# Patient Record
Sex: Female | Born: 1975 | Race: White | Hispanic: No | Marital: Married | Smoking: Former smoker
Health system: Southern US, Community
[De-identification: ages and names within clinical notes are randomized; demographics above are authoritative.]

## PROBLEM LIST (undated history)

## (undated) DIAGNOSIS — O24419 Gestational diabetes mellitus in pregnancy, unspecified control: Secondary | ICD-10-CM

## (undated) DIAGNOSIS — R748 Abnormal levels of other serum enzymes: Secondary | ICD-10-CM

## (undated) DIAGNOSIS — K760 Fatty (change of) liver, not elsewhere classified: Secondary | ICD-10-CM

## (undated) DIAGNOSIS — I10 Essential (primary) hypertension: Secondary | ICD-10-CM

## (undated) DIAGNOSIS — E119 Type 2 diabetes mellitus without complications: Secondary | ICD-10-CM

## (undated) DIAGNOSIS — N181 Chronic kidney disease, stage 1: Secondary | ICD-10-CM

## (undated) DIAGNOSIS — Z8759 Personal history of other complications of pregnancy, childbirth and the puerperium: Secondary | ICD-10-CM

## (undated) DIAGNOSIS — E663 Overweight: Secondary | ICD-10-CM

## (undated) DIAGNOSIS — E785 Hyperlipidemia, unspecified: Secondary | ICD-10-CM

## (undated) HISTORY — DX: Hereditary hemochromatosis: E83.110

## (undated) HISTORY — PX: WISDOM TOOTH EXTRACTION: SHX21

## (undated) HISTORY — DX: Gestational diabetes mellitus in pregnancy, unspecified control: O24.419

## (undated) HISTORY — DX: Fatty (change of) liver, not elsewhere classified: K76.0

## (undated) HISTORY — DX: Hyperlipidemia, unspecified: E78.5

## (undated) HISTORY — DX: Chronic kidney disease, stage 1: N18.1

## (undated) HISTORY — DX: Abnormal levels of other serum enzymes: R74.8

## (undated) HISTORY — DX: Personal history of other complications of pregnancy, childbirth and the puerperium: Z87.59

## (undated) HISTORY — DX: Overweight: E66.3

## (undated) HISTORY — DX: Type 2 diabetes mellitus without complications: E11.9

## (undated) HISTORY — DX: Essential (primary) hypertension: I10

---

## 2011-12-28 ENCOUNTER — Encounter: Payer: Self-pay | Admitting: Pediatric Cardiology

## 2012-01-04 ENCOUNTER — Encounter: Payer: Self-pay | Admitting: Pediatric Cardiology

## 2012-03-16 ENCOUNTER — Inpatient Hospital Stay: Payer: Self-pay

## 2012-03-16 ENCOUNTER — Observation Stay: Payer: Self-pay | Admitting: Obstetrics and Gynecology

## 2012-03-16 LAB — PIH PROFILE
Anion Gap: 12 (ref 7–16)
Chloride: 106 mmol/L (ref 98–107)
Creatinine: 0.83 mg/dL (ref 0.60–1.30)
EGFR (Non-African Amer.): >60
HCT: 33.2 % — ABNORMAL LOW (ref 35.0–47.0)
HGB: 11.3 g/dL — ABNORMAL LOW (ref 12.0–16.0)
MCHC: 34.1 g/dL (ref 32.0–36.0)
Osmolality: 277 (ref 275–301)
Potassium: 3.9 mmol/L (ref 3.5–5.1)
RBC: 3.96 10*6/uL (ref 3.80–5.20)
RDW: 14.1 % (ref 11.5–14.5)
WBC: 8.9 10*3/uL (ref 3.6–11.0)

## 2012-03-16 LAB — PROTEIN / CREATININE RATIO, URINE: Creatinine, Urine: 172.9 mg/dL — ABNORMAL HIGH (ref 30.0–125.0)

## 2012-11-20 ENCOUNTER — Ambulatory Visit: Payer: Self-pay | Admitting: Family Medicine

## 2013-02-15 HISTORY — DX: Hereditary hemochromatosis: E83.110

## 2013-10-03 ENCOUNTER — Other Ambulatory Visit: Payer: Self-pay | Admitting: Urgent Care

## 2013-10-03 LAB — IRON: Iron: 36 ug/dL — ABNORMAL LOW (ref 50–170)

## 2013-10-03 LAB — FERRITIN: FERRITIN (ARMC): 87 ng/mL (ref 8–388)

## 2013-10-03 LAB — HEPATIC FUNCTION PANEL A (ARMC)
ALBUMIN: 3.8 g/dL (ref 3.4–5.0)
ALT: 75 U/L — AB
AST: 52 U/L — AB (ref 15–37)
Alkaline Phosphatase: 170 U/L — ABNORMAL HIGH
Bilirubin, Direct: <0.1 mg/dL (ref 0.00–0.20)
Bilirubin,Total: 0.4 mg/dL (ref 0.2–1.0)
Total Protein: 8.2 g/dL (ref 6.4–8.2)

## 2013-10-03 LAB — ETHANOL
Ethanol %: <0.003 % (ref 0.000–0.080)
Ethanol: <3 mg/dL

## 2013-10-03 LAB — TSH: Thyroid Stimulating Horm: 3.41 u[IU]/mL

## 2013-10-03 LAB — PROTIME-INR
INR: 1
PROTHROMBIN TIME: 12.7 s (ref 11.5–14.7)

## 2013-10-10 ENCOUNTER — Ambulatory Visit: Payer: Self-pay | Admitting: Urgent Care

## 2014-06-25 NOTE — H&P (Signed)
L&D Evaluation:  History Expanded:   HPI 39 yo WF G2P1001 who presnts form the office for elevated BP and headache. labs in the hospital showed p/c ratio of 1105mg , Uric acid 7.2, plt 184, and glucose of 125. CR is 0.83. Pt conceived with ICSI and IVF, she is GBS pos, RUBI, VZI and a gestational diabetic diet controlled with all four glucoses being elevated in the 3 hr OGTT 152/237/246/185, her 1 hr was 265. glucose on admission was 126. unknown blood type and computers are down for internet and cannot get it, HepB is not available now either.    Gravida 2    Term 1    PreTerm 0    Abortion 0    Living 1    Group B Strep Results Maternal (Result >5wks must be treated as unknown) positive    Maternal HIV Negative    Maternal Syphilis Ab Nonreactive    Maternal Varicella Immune    Rubella Results (Maternal) immune    Maternal T-Dap Immune    Presents with elevated BP and headache    Patient's Medical History Diabetes  gestational but most likely long standing,    Patient's Surgical History none    Medications Pre Natal Vitamins    Allergies NKDA    Social History none    Family History Non-Contributory    Current Prenatal Course Notable For gestational   ROS:   ROS All systems were reviewed.  HEENT, CNS, GI, GU, Respiratory, CV, Renal and Musculoskeletal systems were found to be normal.   Exam:   Vital Signs BP >140/90    Urine Protein 2+    General no apparent distress    Mental Status clear    Chest clear    Heart normal sinus rhythm    Abdomen gravid, non-tender    Estimated Fetal Weight Average for gestational age    Fetal Position vertex    Back no CVAT    Edema 1+    Clonus negative    Pelvic no external lesions, 1 cm thick    Mebranes Intact    FHT normal rate with no decels, strictly reactive no decels    Fetal Heart Rate 140    Ucx irregular    Skin dry    Lymph no lymphadenopathy   Impression:   Impression early labor,  evaluation for PIH   Plan:   Plan UA, monitor contractions and for cervical change, monitor BP, PIH panel    Comments admit due to labs and induce with cervidil and then pitocin n the am get plt q 6 hours    Follow Up Appointment in 6 weeks   Electronic Signatures: Adria DevonKlett, Maleiya Pergola (MD)  (Signed 30-Jan-14 21:08)  Authored: L&D Evaluation   Last Updated: 30-Jan-14 21:08 by Adria DevonKlett, Joliana Claflin (MD)

## 2014-10-07 DIAGNOSIS — E1129 Type 2 diabetes mellitus with other diabetic kidney complication: Secondary | ICD-10-CM | POA: Insufficient documentation

## 2014-10-07 DIAGNOSIS — K760 Fatty (change of) liver, not elsewhere classified: Secondary | ICD-10-CM | POA: Insufficient documentation

## 2014-10-07 DIAGNOSIS — N181 Chronic kidney disease, stage 1: Secondary | ICD-10-CM | POA: Insufficient documentation

## 2014-10-07 DIAGNOSIS — R748 Abnormal levels of other serum enzymes: Secondary | ICD-10-CM | POA: Insufficient documentation

## 2014-10-07 DIAGNOSIS — E785 Hyperlipidemia, unspecified: Secondary | ICD-10-CM | POA: Insufficient documentation

## 2014-10-07 DIAGNOSIS — I129 Hypertensive chronic kidney disease with stage 1 through stage 4 chronic kidney disease, or unspecified chronic kidney disease: Secondary | ICD-10-CM | POA: Insufficient documentation

## 2014-10-07 DIAGNOSIS — E1165 Type 2 diabetes mellitus with hyperglycemia: Secondary | ICD-10-CM

## 2014-10-07 DIAGNOSIS — IMO0002 Reserved for concepts with insufficient information to code with codable children: Secondary | ICD-10-CM | POA: Insufficient documentation

## 2014-10-14 ENCOUNTER — Encounter: Payer: Self-pay | Admitting: Family Medicine

## 2014-10-14 ENCOUNTER — Ambulatory Visit (INDEPENDENT_AMBULATORY_CARE_PROVIDER_SITE_OTHER): Payer: 59 | Admitting: Family Medicine

## 2014-10-14 VITALS — BP 159/114 | HR 92 | Temp 98.3°F | Ht 67.0 in | Wt 179.0 lb

## 2014-10-14 DIAGNOSIS — R748 Abnormal levels of other serum enzymes: Secondary | ICD-10-CM

## 2014-10-14 DIAGNOSIS — E1129 Type 2 diabetes mellitus with other diabetic kidney complication: Secondary | ICD-10-CM | POA: Diagnosis not present

## 2014-10-14 DIAGNOSIS — E785 Hyperlipidemia, unspecified: Secondary | ICD-10-CM

## 2014-10-14 DIAGNOSIS — E1165 Type 2 diabetes mellitus with hyperglycemia: Secondary | ICD-10-CM | POA: Diagnosis not present

## 2014-10-14 DIAGNOSIS — IMO0002 Reserved for concepts with insufficient information to code with codable children: Secondary | ICD-10-CM

## 2014-10-14 DIAGNOSIS — E663 Overweight: Secondary | ICD-10-CM

## 2014-10-14 DIAGNOSIS — I10 Essential (primary) hypertension: Secondary | ICD-10-CM | POA: Diagnosis not present

## 2014-10-14 DIAGNOSIS — K760 Fatty (change of) liver, not elsewhere classified: Secondary | ICD-10-CM | POA: Diagnosis not present

## 2014-10-14 MED ORDER — AMLODIPINE BESYLATE 10 MG PO TABS: 10.0000 mg | ORAL_TABLET | Freq: Every day | ORAL | Status: DC

## 2014-10-14 NOTE — Assessment & Plan Note (Signed)
Uncontrolled; will start back on amlodipine, HOLD ACE-I for now

## 2014-10-14 NOTE — Assessment & Plan Note (Signed)
Was evaluated by GI in 2015; US done; check labs today; keep control of cholesterol and sugars

## 2014-10-14 NOTE — Assessment & Plan Note (Signed)
Check lipids today, last food was breakfast (two sausage biscuits, skipped lunch)

## 2014-10-14 NOTE — Assessment & Plan Note (Signed)
Check liver functions today

## 2014-10-14 NOTE — Assessment & Plan Note (Signed)
Check A1C and urine microalbumin 

## 2014-10-14 NOTE — Patient Instructions (Addendum)
Start the amlodipine 10 mg daily for blood pressure Start checking sugars 3x a day We'll contact you tomorrow with the lab results Try the DASH guidelines Return in 2 weeks for recheck of your blood pressure and please do bring your blood sugar readings with you then Please do see your eye doctor every year Avoid NSAIDS like Advil, Motrin, Aleve, etc. which can raise blood pressure I'm glad you and your husband are going to start walking more, as that will help lower your blood pressure and sugars and cholesterol   DASH Eating Plan DASH stands for "Dietary Approaches to Stop Hypertension." The DASH eating plan is a healthy eating plan that has been shown to reduce high blood pressure (hypertension). Additional health benefits may include reducing the risk of type 2 diabetes mellitus, heart disease, and stroke. The DASH eating plan may also help with weight loss. WHAT DO I NEED TO KNOW ABOUT THE DASH EATING PLAN? For the DASH eating plan, you will follow these general guidelines:  Choose foods with a percent daily value for sodium of less than 5% (as listed on the food label).  Use salt-free seasonings or herbs instead of table salt or sea salt.  Check with your health care provider or pharmacist before using salt substitutes.  Eat lower-sodium products, often labeled as "lower sodium" or "no salt added."  Eat fresh foods.  Eat more vegetables, fruits, and low-fat dairy products.  Choose whole grains. Look for the word "whole" as the first word in the ingredient list.  Choose fish and skinless chicken or [place] more often than red meat. Limit fish, poultry, and meat to 6 oz (170 g) each day.  Limit sweets, desserts, sugars, and sugary drinks.  Choose heart-healthy fats.  Limit cheese to 1 oz (28 g) per day.  Eat more home-cooked food and less restaurant, buffet, and fast food.  Limit fried foods.  Cook foods using methods other than frying.  Limit canned vegetables. If you  do use them, rinse them well to decrease the sodium.  When eating at a restaurant, ask that your food be prepared with less salt, or no salt if possible. WHAT FOODS CAN I EAT? Seek help from a dietitian for individual calorie needs. Grains Whole grain or whole wheat bread. Brown rice. Whole grain or whole wheat pasta. Quinoa, bulgur, and whole grain cereals. Low-sodium cereals. Corn or whole wheat flour tortillas. Whole grain cornbread. Whole grain crackers. Low-sodium crackers. Vegetables Fresh or frozen vegetables (raw, steamed, roasted, or grilled). Low-sodium or reduced-sodium tomato and vegetable juices. Low-sodium or reduced-sodium tomato sauce and paste. Low-sodium or reduced-sodium canned vegetables.  Fruits All fresh, canned (in natural juice), or frozen fruits. Meat and Other Protein Products Ground beef (85% or leaner), grass-fed beef, or beef trimmed of fat. Skinless chicken or [place]. Ground chicken or [place]. Pork trimmed of fat. All fish and seafood. Eggs. Dried beans, peas, or lentils. Unsalted nuts and seeds. Unsalted canned beans. Dairy Low-fat dairy products, such as skim or 1% milk, 2% or reduced-fat cheeses, low-fat ricotta or cottage cheese, or plain low-fat yogurt. Low-sodium or reduced-sodium cheeses. Fats and Oils Tub margarines without trans fats. Light or reduced-fat mayonnaise and salad dressings (reduced sodium). Avocado. Safflower, olive, or canola oils. Natural peanut or almond butter. Other Unsalted popcorn and pretzels. The items listed above may not be a complete list of recommended foods or beverages. Contact your dietitian for more options. WHAT FOODS ARE NOT RECOMMENDED? Grains White bread. White pasta. White rice.  Refined cornbread. Bagels and croissants. Crackers that contain trans fat. Vegetables Creamed or fried vegetables. Vegetables in a cheese sauce. Regular canned vegetables. Regular canned tomato sauce and paste. Regular tomato and vegetable  juices. Fruits Dried fruits. Canned fruit in light or heavy syrup. Fruit juice. Meat and Other Protein Products Fatty cuts of meat. Ribs, chicken wings, bacon, sausage, bologna, salami, chitterlings, fatback, hot dogs, bratwurst, and packaged luncheon meats. Salted nuts and seeds. Canned beans with salt. Dairy Whole or 2% milk, cream, half-and-half, and cream cheese. Whole-fat or sweetened yogurt. Full-fat cheeses or blue cheese. Nondairy creamers and whipped toppings. Processed cheese, cheese spreads, or cheese curds. Condiments Onion and garlic salt, seasoned salt, table salt, and sea salt. Canned and packaged gravies. Worcestershire sauce. Tartar sauce. Barbecue sauce. Teriyaki sauce. Soy sauce, including reduced sodium. Steak sauce. Fish sauce. Oyster sauce. Cocktail sauce. Horseradish. Ketchup and mustard. Meat flavorings and tenderizers. Bouillon cubes. Hot sauce. Tabasco sauce. Marinades. Taco seasonings. Relishes. Fats and Oils Butter, stick margarine, lard, shortening, ghee, and bacon fat. Coconut, palm kernel, or palm oils. Regular salad dressings. Other Pickles and olives. Salted popcorn and pretzels. The items listed above may not be a complete list of foods and beverages to avoid. Contact your dietitian for more information. WHERE CAN I FIND MORE INFORMATION? National Heart, Lung, and Blood Institute: [employer] Document Released: 01/21/2011 Document Revised: 06/18/2013 Document Reviewed: 12/06/2012 [employer] Patient Information 2015 [place], [place]. This information is not intended to replace advice given to you by your health care provider. Make sure you discuss any questions you have with your health care provider.

## 2014-10-14 NOTE — Progress Notes (Signed)
BP 159/114 mmHg  Pulse 92  Temp(Src) 98.3 F (36.8 C)  Ht 5\' 7"  (1.702 m)  Wt 179 lb (81.194 kg)  BMI 28.03 kg/m2  SpO2 97%  LMP 09/21/2014 (Exact Date)   Subjective:    Patient ID: Monica Sims, female    DOB: 31-Oct-1975, 39 y.o.   MRN: [id number]  HPI: Monica Sims is a 39 y.o. female  Chief Complaint  Patient presents with  . Diabetes  . Hypertension  . Hyperlipidemia   She just had a wisdom tooth pulled, BP 189 over something at dentist's office; last BP meds 2-3 months ago; not adding salt to food at home; no decongestants; drinks diet [facility], two 20 ounce bottles a day; she does not exercise, but is going to start walking; not a good sleeper  Diabetes; blood sugars running high, 262 last Thursday at dentist's office; not checking at home; no numbness or tingling in the feet; having some blurred vision; not much dry mouth; nocturia 2-3 x a night  Cholesterol; limits foods, rotisserie cooking, no oil; no eggs at home; occasional cheese; got off cheese while pregnant  She will get a flu shot this fall; she does not want a pneumonia vaccine  Relevant past medical, surgical, family and social history reviewed and updated as indicated. Interim medical history since our last visit reviewed. Allergies and medications reviewed and updated.  Review of Systems  Per HPI unless specifically indicated above     Objective:    BP 159/114 mmHg  Pulse 92  Temp(Src) 98.3 F (36.8 C)  Ht 5\' 7"  (1.702 m)  Wt 179 lb (81.194 kg)  BMI 28.03 kg/m2  SpO2 97%  LMP 09/21/2014 (Exact Date)  Wt Readings from Last 3 Encounters:  10/14/14 179 lb (81.194 kg)  01/09/14 178 lb (80.74 kg)    Physical Exam  Constitutional: She appears well-developed and well-nourished. No distress.  HENT:  Head: Normocephalic and atraumatic.  Eyes: EOM are normal. No scleral icterus.  Neck: No thyromegaly present.  Cardiovascular: Normal rate, regular rhythm and normal heart sounds.    No murmur heard. Pulmonary/Chest: Effort normal and breath sounds normal. No respiratory distress. She has no wheezes.  Abdominal: Soft. Bowel sounds are normal. She exhibits no distension.  Musculoskeletal: Normal range of motion. She exhibits no edema.  Neurological: She is alert. She exhibits normal muscle tone.  Skin: Skin is warm and dry. She is not diaphoretic. No pallor.  Psychiatric: She has a normal mood and affect. Her behavior is normal. Judgment and thought content normal.   Diabetic Foot Form - Detailed   Diabetic Foot Exam - detailed  Diabetic Foot exam was performed with the following findings:  Yes 10/14/2014  2:15 PM  Visual Foot Exam completed.:  Yes  Are the shoes appropriate in style and fit?:  Yes  Are the toenails long?:  No  Are the toenails thick?:  No  Are the toenails ingrown?:  No    Pulse Foot Exam completed.:  Yes  Right Dorsalis Pedis:  Present Left Dorsalis Pedis:  Present  Sensory Foot Exam Completed.:  Yes  Semmes-Weinstein Monofilament Test  R Site 1-Great Toe:  Pos L Site 1-Great Toe:  Pos  R Site 4:  Pos L Site 4:  Pos  R Site 5:  Pos L Site 5:  Pos        Results for orders placed or performed in visit on 10/03/13  Protime-INR  Result Value Ref Range  Prothrombin Time 12.7 11.5-14.7 secs   INR 1.0   TSH  Result Value Ref Range   Thyroid Stimulating Horm 3.41 uIU/mL  Hepatic Function Panel A [facility])  Result Value Ref Range   SGOT(AST) 52 (H) 15-37 Unit/L   SGPT (ALT) 75 (H) U/L   Alkaline Phosphatase 170 (H) Unit/L   Albumin 3.8 3.4-5.0 g/dL   Total Protein 8.2 4.0-9.8 g/dL   Bilirubin,Total 0.4 1.1-9.1 mg/dL   Bilirubin, Direct < 0.1 0.00-0.20 mg/dL  Ferritin  Result Value Ref Range   Ferritin ([facility]) 87 8-388 ng/mL  Iron  Result Value Ref Range   Iron 36 (L) 50-170 mcg/dL  Ethanol  Result Value Ref Range   Ethanol < 3 mg/dL   Ethanol % < 4.782 9.562-1.308 %      Assessment & Plan:   Problem List Items Addressed This  Visit      Cardiovascular and Mediastinum   Hypertension    Uncontrolled; will start back on amlodipine, HOLD ACE-I for now      Relevant Medications   amLODipine (NORVASC) 10 MG tablet     Digestive   Hepatic steatosis    Check liver functions today      Relevant Orders   Comprehensive metabolic panel     Endocrine   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 - Primary    Check A1C and urine microalbumin      Relevant Orders   Hgb A1c w/o eAG   Microalbumin / creatinine urine ratio     Other   Hyperlipidemia    Check lipids today, last food was breakfast (two sausage biscuits, skipped lunch)      Relevant Medications   amLODipine (NORVASC) 10 MG tablet   Other Relevant Orders   Lipid Panel w/o Chol/HDL Ratio   Elevated liver enzymes    Was evaluated by GI in 2015; US done; check labs today; keep control of cholesterol and sugars      Relevant Orders   Comprehensive metabolic panel    Other Visit Diagnoses    Overweight (BMI 25.0-29.9)           Follow up plan: Return in 2 weeks (on 10/28/2014).  Orders Placed This Encounter  Procedures  . Hgb A1c w/o eAG  . Lipid Panel w/o Chol/HDL Ratio  . Microalbumin / creatinine urine ratio  . Comprehensive metabolic panel   Meds ordered this encounter  Medications  . HYDROcodone-acetaminophen (NORCO/VICODIN) 5-325 MG per tablet    Sig:   . DISCONTD: ibuprofen (ADVIL,MOTRIN) 600 MG tablet    Sig: Take 600 mg by mouth.  . penicillin v potassium (VEETID) 500 MG tablet    Sig: Take 500 mg by mouth.  Marland Kitchen amLODipine (NORVASC) 10 MG tablet    Sig: Take 1 tablet (10 mg total) by mouth daily.    Dispense:  30 tablet    Refill:  2

## 2014-10-15 ENCOUNTER — Telehealth: Payer: Self-pay | Admitting: Family Medicine

## 2014-10-15 LAB — HGB A1C W/O EAG: Hgb A1c MFr Bld: 9.6 % — ABNORMAL HIGH (ref 4.8–5.6)

## 2014-10-15 LAB — COMPREHENSIVE METABOLIC PANEL
A/G RATIO: 1.7 (ref 1.1–2.5)
ALT: 37 IU/L — AB (ref 0–32)
AST: 32 IU/L (ref 0–40)
Albumin: 4.3 g/dL (ref 3.5–5.5)
Alkaline Phosphatase: 134 IU/L — ABNORMAL HIGH (ref 39–117)
BUN/Creatinine Ratio: 17 (ref 9–23)
BUN: 11 mg/dL (ref 6–24)
Bilirubin Total: 0.4 mg/dL (ref 0.0–1.2)
CALCIUM: 9 mg/dL (ref 8.7–10.2)
CO2: 19 mmol/L (ref 18–29)
CREATININE: 0.65 mg/dL (ref 0.57–1.00)
Chloride: 97 mmol/L (ref 97–108)
GFR, EST AFRICAN AMERICAN: 128 mL/min/{1.73_m2} (ref >59)
GFR, EST NON AFRICAN AMERICAN: 111 mL/min/{1.73_m2} (ref >59)
GLOBULIN, TOTAL: 2.5 g/dL (ref 1.5–4.5)
Glucose: 235 mg/dL — ABNORMAL HIGH (ref 65–99)
Potassium: 4.1 mmol/L (ref 3.5–5.2)
SODIUM: 135 mmol/L (ref 134–144)
TOTAL PROTEIN: 6.8 g/dL (ref 6.0–8.5)

## 2014-10-15 LAB — LIPID PANEL W/O CHOL/HDL RATIO
CHOLESTEROL TOTAL: 189 mg/dL (ref 100–199)
HDL: 36 mg/dL — AB (ref >39)
LDL CALC: 73 mg/dL (ref 0–99)
TRIGLYCERIDES: 398 mg/dL — AB (ref 0–149)
VLDL CHOLESTEROL CAL: 80 mg/dL — AB (ref 5–40)

## 2014-10-15 LAB — MICROALBUMIN / CREATININE URINE RATIO
CREATININE, UR: 107.5 mg/dL
MICROALB/CREAT RATIO: 500.2 mg/g creat — ABNORMAL HIGH (ref 0.0–30.0)
Microalbumin, Urine: 537.7 ug/mL

## 2014-10-15 MED ORDER — INSULIN DETEMIR 100 UNIT/ML FLEXPEN: 20.0000 [IU] | PEN_INJECTOR | Freq: Every day | SUBCUTANEOUS | Status: DC

## 2014-10-15 MED ORDER — LINAGLIPTIN 5 MG PO TABS: 5.0000 mg | ORAL_TABLET | Freq: Every day | ORAL | Status: DC

## 2014-10-15 NOTE — Telephone Encounter (Signed)
Called and notified patient of results.

## 2014-10-15 NOTE — Telephone Encounter (Signed)
Let patient know that we'll get her back on some of her medicines Start back on the once-a-day long-acting insulin, Levemir Start with fifteen units once a day for five days, then go up to twenty units once a day Bring sugar readings to appt in a few weeks I'd also like to add a pill (NOT metformin); the one I am choosing is pregnancy category B; most of the other diabetes pills are category C (less safe), so this way we can get her sugars down without being overly worried in case they do conceive in the coming months Her cholesterol shows high TG, most likely because of her sugars; fish oil twice a day and really watching diet should help this Liver better than last year

## 2014-10-17 ENCOUNTER — Telehealth: Payer: Self-pay

## 2014-10-17 NOTE — Telephone Encounter (Signed)
We do not have any coupons for Levemir and I don't think that will help with it since it requires prior auth. Do you want to have Tiffany try to get it approved?

## 2014-10-17 NOTE — Telephone Encounter (Signed)
Check for Tradjenta savings cards please, not the Levemir; thanks

## 2014-10-17 NOTE — Telephone Encounter (Signed)
Her insurance is not covering her Jearld Lesch, it is $413.90. Can you please change to something else? Also The Levemir requires prior auth. Do you want [place] to get that or change to something else?

## 2014-10-17 NOTE — Telephone Encounter (Signed)
Please check for savings card and print out Rx again if she needs a new one to try to fill AFTER the savings card is activated

## 2014-10-18 ENCOUNTER — Telehealth: Payer: Self-pay

## 2014-10-18 MED ORDER — INSULIN GLARGINE 100 UNIT/ML SOLOSTAR PEN: 20.0000 [IU] | PEN_INJECTOR | Freq: Every day | SUBCUTANEOUS | Status: DC

## 2014-10-18 NOTE — Telephone Encounter (Signed)
Patient's insurance required prior auth on her Levemir. Tiffany tried to get it approved, but they denied it and said she has to try Lantus first. Do you want to change it?

## 2014-10-18 NOTE — Telephone Encounter (Signed)
Patient notified

## 2014-10-18 NOTE — Telephone Encounter (Signed)
Left message for patient to call. Coupon for Tradjenta placed up front for patient to pick up. Prior Auth request given to Tiffany for Levemir.

## 2014-10-18 NOTE — Telephone Encounter (Signed)
I sent new Rx for Lantus instead; that's fine; let her know same instructions as before, just different brand of insulin

## 2014-10-28 ENCOUNTER — Telehealth: Payer: Self-pay | Admitting: Family Medicine

## 2014-10-28 ENCOUNTER — Ambulatory Visit: Payer: 59 | Admitting: Family Medicine

## 2014-10-28 NOTE — Telephone Encounter (Signed)
Routing to provider  

## 2014-10-28 NOTE — Telephone Encounter (Signed)
I would like her on EITHER Tradjenta, Nesina, Onglyza, or Januvia Check to see which savings card(s) we have The pharmaceutical reps tell us that if she runs it through with the card first, most are either NO charge to her for two years or low co-pay Have her work with savings card and pharmacist; I'll send Rx for whichever is cheapest for her

## 2014-10-28 NOTE — Telephone Encounter (Signed)
Pt would like to speak to tradjenta is way too expensive because insurance wont cover it.

## 2014-10-30 MED ORDER — SAXAGLIPTIN HCL 5 MG PO TABS: 5.0000 mg | ORAL_TABLET | Freq: Every day | ORAL | Status: DC

## 2014-10-30 NOTE — Telephone Encounter (Signed)
Pt called stated the only medication her insurance would be willing to cover is the Onglyza. Thanks.

## 2014-10-30 NOTE — Telephone Encounter (Signed)
Patient will come pick up Rx and card

## 2014-10-30 NOTE — Telephone Encounter (Signed)
Great I have a prescription here for her and a savings card If she will activate the card BEFORE she takes the prescription in, it should cover it

## 2014-10-30 NOTE — Telephone Encounter (Signed)
Patient is going to call her insurance to see if any of these medications will be covered before she comes to pick up the discount cards.  She will let us know.  She does not want to start anything that her insurance is then going to not cover.

## 2014-11-08 ENCOUNTER — Ambulatory Visit (INDEPENDENT_AMBULATORY_CARE_PROVIDER_SITE_OTHER): Payer: 59 | Admitting: Family Medicine

## 2014-11-08 ENCOUNTER — Encounter: Payer: Self-pay | Admitting: Family Medicine

## 2014-11-08 VITALS — BP 130/84 | HR 96 | Temp 98.1°F | Wt 176.0 lb

## 2014-11-08 DIAGNOSIS — E785 Hyperlipidemia, unspecified: Secondary | ICD-10-CM | POA: Diagnosis not present

## 2014-11-08 DIAGNOSIS — I10 Essential (primary) hypertension: Secondary | ICD-10-CM

## 2014-11-08 DIAGNOSIS — E119 Type 2 diabetes mellitus without complications: Secondary | ICD-10-CM | POA: Diagnosis not present

## 2014-11-08 DIAGNOSIS — E1129 Type 2 diabetes mellitus with other diabetic kidney complication: Secondary | ICD-10-CM | POA: Diagnosis not present

## 2014-11-08 DIAGNOSIS — R809 Proteinuria, unspecified: Secondary | ICD-10-CM | POA: Diagnosis not present

## 2014-11-08 DIAGNOSIS — R748 Abnormal levels of other serum enzymes: Secondary | ICD-10-CM | POA: Diagnosis not present

## 2014-11-08 DIAGNOSIS — IMO0002 Reserved for concepts with insufficient information to code with codable children: Secondary | ICD-10-CM

## 2014-11-08 DIAGNOSIS — E1165 Type 2 diabetes mellitus with hyperglycemia: Secondary | ICD-10-CM

## 2014-11-08 DIAGNOSIS — E663 Overweight: Secondary | ICD-10-CM | POA: Diagnosis not present

## 2014-11-08 DIAGNOSIS — K760 Fatty (change of) liver, not elsewhere classified: Secondary | ICD-10-CM | POA: Diagnosis not present

## 2014-11-08 NOTE — Patient Instructions (Addendum)
Increase long-acting insulin from 20 units to 23 units every night for 3 days, and then go up to 25 units every night Goal fasting blood sugars is less than 150 Goal blood sugar after meals is less than 200 Return on or just after November 30th for fasting labs Keep up the great work with your efforts

## 2014-11-08 NOTE — Progress Notes (Signed)
BP 130/84 mmHg  Pulse 96  Temp(Src) 98.1 F (36.7 C)  Wt 176 lb (79.833 kg)  SpO2 99%  LMP 09/21/2014 (Exact Date)   Subjective:    Patient ID: Monica Sims, female    DOB: 02-22-1975, 39 y.o.   MRN: [id number]  HPI: Monica Sims is a 39 y.o. female  Chief Complaint  Patient presents with  . Hypertension  . Diabetes   Type 2 diabetes; her fasting glucose readings have been 235-285 the last two weeks; just started Onglyza on Tuesday, today was 4th dose so it hasn't taken full effect yet  Post-prandial glucose checks were just a few and we reviewed those together as well; today 235 prior to and then 349 after meal; pancake with water, no butter, piece of liver pudding; syrup was sugar-free; no fruit  Hypertension; really checking sodium on the labels at the store; she and her husband have found out that their daughter did not have any chromosomal abnormalities, nothing genetic, so they are considering trying to have another baby soon; this was brought up when we started talking about ACE-I use during pregnancy  High cholesterol; doing egg whites instead of regular eggs; she and her husband are really try ing to eat better  She is walking more, got up to 10k steps a few times; she has a monitor on her wrist; 10,177 steps she shows me on one day; goes to the mall to walk when the weather is bad; she has lost two pounds since last visit  Relevant past medical, surgical, family and social history reviewed and updated as indicated. Interim medical history since our last visit reviewed. Allergies and medications reviewed and updated.  Review of Systems Per HPI unless specifically indicated above     Objective:    BP 130/84 mmHg  Pulse 96  Temp(Src) 98.1 F (36.7 C)  Wt 176 lb (79.833 kg)  SpO2 99%  LMP 09/21/2014 (Exact Date)  Wt Readings from Last 3 Encounters:  11/08/14 176 lb (79.833 kg)  10/14/14 179 lb (81.194 kg)  01/09/14 178 lb (80.74 kg)    Physical  Exam  Constitutional: She appears well-developed and well-nourished. No distress.  overweight  HENT:  Head: Normocephalic and atraumatic.  Eyes: EOM are normal. No scleral icterus.  Neck: No thyromegaly present.  Cardiovascular: Normal rate and normal heart sounds.   No murmur heard. Pulmonary/Chest: Effort normal. No respiratory distress. She has no wheezes.  Abdominal: She exhibits no distension.  Musculoskeletal: Normal range of motion. She exhibits no edema.  Neurological: She is alert. She exhibits normal muscle tone.  Skin: Skin is warm and dry. She is not diaphoretic. No pallor.  Psychiatric: She has a normal mood and affect. Her behavior is normal. Judgment and thought content normal.   Diabetic Foot Form - Detailed   Diabetic Foot Exam - detailed  Diabetic Foot exam was performed with the following findings:  Yes 11/08/2014  2:07 PM  Visual Foot Exam completed.:  Yes  Is there swelling or and abnormal foot shape?:  No  Are the toenails long?:  No  Are the toenails thick?:  No  Is there a claw toe deformity?:  No  Are the toenails ingrown?:  No    Pulse Foot Exam completed.:  Yes  Right Dorsalis Pedis:  Present Left Dorsalis Pedis:  Present  Sensory Foot Exam Completed.:  Yes  Swelling:  Yes (Comment: just a little right foot)  Semmes-Weinstein Monofilament Test  R Site 1-Great Toe:  Pos L Site 1-Great Toe:  Pos  R Site 4:  Pos L Site 4:  Pos  R Site 5:  Pos L Site 5:  Pos        Results for orders placed or performed in visit on 10/14/14  Hgb A1c w/o eAG  Result Value Ref Range   Hgb A1c MFr Bld 9.6 (H) 4.8 - 5.6 %  Lipid Panel w/o Chol/HDL Ratio  Result Value Ref Range   Cholesterol, Total 189 100 - 199 mg/dL   Triglycerides 161 (H) 0 - 149 mg/dL   HDL 36 (L) >09 mg/dL   VLDL Cholesterol Cal 80 (H) 5 - 40 mg/dL   LDL Calculated 73 0 - 99 mg/dL  Microalbumin / creatinine urine ratio  Result Value Ref Range   Creatinine, Urine 107.5 Not Estab. mg/dL    Microalbum.,U,Random 537.7 Not Estab. ug/mL   MICROALB/CREAT RATIO 500.2 (H) 0.0 - 30.0 mg/g creat  Comprehensive metabolic panel  Result Value Ref Range   Glucose 235 (H) 65 - 99 mg/dL   BUN 11 6 - 24 mg/dL   Creatinine, Ser 6.04 0.57 - 1.00 mg/dL   GFR calc non Af Amer 111 >59 mL/min/1.73   GFR calc Af Amer 128 >59 mL/min/1.73   BUN/Creatinine Ratio 17 9 - 23   Sodium 135 134 - 144 mmol/L   Potassium 4.1 3.5 - 5.2 mmol/L   Chloride 97 97 - 108 mmol/L   CO2 19 18 - 29 mmol/L   Calcium 9.0 8.7 - 10.2 mg/dL   Total Protein 6.8 6.0 - 8.5 g/dL   Albumin 4.3 3.5 - 5.5 g/dL   Globulin, Total 2.5 1.5 - 4.5 g/dL   Albumin/Globulin Ratio 1.7 1.1 - 2.5   Bilirubin Total 0.4 0.0 - 1.2 mg/dL   Alkaline Phosphatase 134 (H) 39 - 117 IU/L   AST 32 0 - 40 IU/L   ALT 37 (H) 0 - 32 IU/L      Assessment & Plan:   Problem List Items Addressed This Visit      Cardiovascular and Mediastinum   Hypertension    We are not going to use ACE-I, as she and her husband may be considering trying to conceive in the coming weeks; continue current CCB        Digestive   Hepatic steatosis    Weight loss, glucose control, lipid control, more activity        Endocrine   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 - Primary    She is really working on better eating, becoming more active; she just recently started the Onglyza so that should bring sugars down some additionally over the coming weeks; check FSBS 2 to 3x a day on average; foot exam by MD today; recheck A1C and return for visit on or just after Jan 14, 2015        Genitourinary   Microalbuminuria due to type 2 diabetes mellitus    The patient and her husband are likely going to try to get pregnant in the coming weeks or months; I am not going to use an ACE-I, therefore, at this time; tighter control of A1C important, obviously; check urine microalbumin at follow-up; if not better, will refer to nephrologist        Other    Hyperlipidemia    Glad she is eating better, becoming more active, working on weight loss; recheck fasting lipids in late Nov or early Dec at f/u visit  Elevated liver enzymes    She has seen gastroenterologist; had additional testing; recheck showed improvement      Overweight (BMI 25.0-29.9)    Praised patient for efforts at weight loss, smarter eating, becoming more active; I would love to see her get her BMI under 25        She declined flu vaccine today She does not want pneumonia vaccine  Follow up plan: Return in about 2 months (around 01/15/2015), or or just after, for fasting labs and visit.  An after-visit summary was printed and given to the patient at check-out.  Please see the patient instructions which may contain other information and recommendations beyond what is mentioned above in the assessment and plan.

## 2014-11-10 ENCOUNTER — Telehealth: Payer: Self-pay | Admitting: Family Medicine

## 2014-11-10 DIAGNOSIS — Z3169 Encounter for other general counseling and advice on procreation: Secondary | ICD-10-CM | POA: Insufficient documentation

## 2014-11-10 MED ORDER — PRENATAL VITAMINS PLUS 27-1 MG PO TABS: 1.0000 | ORAL_TABLET | Freq: Every day | ORAL | Status: DC

## 2014-11-10 NOTE — Assessment & Plan Note (Signed)
Weight loss, glucose control, lipid control, more activity

## 2014-11-10 NOTE — Assessment & Plan Note (Signed)
We are not going to use ACE-I, as she and her husband may be considering trying to conceive in the coming weeks; continue current CCB

## 2014-11-10 NOTE — Assessment & Plan Note (Signed)
The patient and her husband are likely going to try to get pregnant in the coming weeks or months; I am not going to use an ACE-I, therefore, at this time; tighter control of A1C important, obviously; check urine microalbumin at follow-up; if not better, will refer to nephrologist

## 2014-11-10 NOTE — Assessment & Plan Note (Addendum)
She is really working on better eating, becoming more active; she just recently started the Onglyza so that should bring sugars down some additionally over the coming weeks; check FSBS 2 to 3x a day on average; foot exam by MD today; recheck A1C and return for visit on or just after Jan 14, 2015

## 2014-11-10 NOTE — Telephone Encounter (Signed)
Please let patient know that we are sorry to hear her daughter is in the hospital; we hope she will be better soon and able to return home I thought more about her and husband thinking about another baby soon; I would like for patient to start prenatal vitamins now and consult with an OB-GYN for a preconception visit; with her being on insulin and having diabetes, she has higher risk during her pregnancy that other women, so I'd like to have the best chance as she is starting out by meeting with an OB to talk about her health problems, look over her medicines, and make any changes needed BEFORE they get pregnant Thank you

## 2014-11-10 NOTE — Assessment & Plan Note (Signed)
She has seen gastroenterologist; had additional testing; recheck showed improvement

## 2014-11-10 NOTE — Assessment & Plan Note (Signed)
Praised patient for efforts at weight loss, smarter eating, becoming more active; I would love to see her get her BMI under 25

## 2014-11-10 NOTE — Assessment & Plan Note (Signed)
Glad she is eating better, becoming more active, working on weight loss; recheck fasting lipids in late Nov or early Dec at f/u visit

## 2014-11-11 NOTE — Telephone Encounter (Signed)
I spoke with patient, she states she would really like to use a nurse midwife and go to a small office (unlike [facility]) where she will have a more one on one experience. I advised her to take care of her daughter currently and then maybe look around and decide who she'd like to go see and let us know and we'd be happy to do the referral. She was very appreciative of the call.

## 2015-01-13 ENCOUNTER — Other Ambulatory Visit: Payer: Self-pay | Admitting: Family Medicine

## 2015-01-13 NOTE — Telephone Encounter (Signed)
Dr Lada patient-routing to provider. 

## 2015-01-15 ENCOUNTER — Ambulatory Visit: Payer: 59

## 2015-01-15 DIAGNOSIS — Z23 Encounter for immunization: Secondary | ICD-10-CM

## 2015-01-20 ENCOUNTER — Ambulatory Visit: Payer: 59 | Admitting: Family Medicine

## 2015-01-21 ENCOUNTER — Encounter: Payer: 59 | Admitting: Certified Nurse Midwife

## 2015-01-27 ENCOUNTER — Ambulatory Visit: Payer: 59 | Admitting: Family Medicine

## 2015-02-06 ENCOUNTER — Ambulatory Visit: Payer: 59 | Admitting: Family Medicine

## 2015-02-11 ENCOUNTER — Encounter: Payer: 59 | Admitting: Certified Nurse Midwife

## 2015-03-06 ENCOUNTER — Encounter: Payer: Self-pay | Admitting: Family Medicine

## 2015-03-06 ENCOUNTER — Ambulatory Visit (INDEPENDENT_AMBULATORY_CARE_PROVIDER_SITE_OTHER): Payer: 59 | Admitting: Family Medicine

## 2015-03-06 VITALS — BP 128/86 | HR 76 | Temp 98.6°F | Ht 67.2 in | Wt 187.2 lb

## 2015-03-06 DIAGNOSIS — IMO0002 Reserved for concepts with insufficient information to code with codable children: Secondary | ICD-10-CM

## 2015-03-06 DIAGNOSIS — F411 Generalized anxiety disorder: Secondary | ICD-10-CM

## 2015-03-06 DIAGNOSIS — Z794 Long term (current) use of insulin: Secondary | ICD-10-CM | POA: Diagnosis not present

## 2015-03-06 DIAGNOSIS — E1165 Type 2 diabetes mellitus with hyperglycemia: Secondary | ICD-10-CM

## 2015-03-06 DIAGNOSIS — E1121 Type 2 diabetes mellitus with diabetic nephropathy: Secondary | ICD-10-CM

## 2015-03-06 MED ORDER — PROPRANOLOL HCL 10 MG PO TABS: 10.0000 mg | ORAL_TABLET | Freq: Three times a day (TID) | ORAL | Status: DC | PRN

## 2015-03-06 NOTE — Progress Notes (Signed)
BP 128/86 mmHg  Pulse 76  Temp(Src) 98.6 F (37 C)  Ht 5' 7.2" (1.707 m)  Wt 187 lb 3.2 oz (84.913 kg)  BMI 29.14 kg/m2  SpO2 99%  LMP 03/04/2015 (Exact Date)   Subjective:    Patient ID: Monica Sims, female    DOB: 1975/09/11, 40 y.o.   MRN: [id number]  HPI: Monica Sims is a 40 y.o. female  Chief Complaint  Patient presents with  . Follow-up    Patient states everything is going good.    She has anxiety She has always had the issue, but seems to be getting worse lately Doesn't have a problem speaking, but turns red in the face Frequent loose stools if she has something stressful or important to do She would like to take something for anxiety if she has to go anywhere Gets flustered and hot Not sure about family hx about anxiety She has had this since high school, getting progressively worse, to the point where she has to stop to go to the bathroom It's awkward, hits her at the moment; hsuband says she turns red; heats up her neck and face  Relevant past medical, surgical, family and social history reviewed and updated as indicated. Interim medical history since our last visit reviewed. Allergies and medications reviewed and updated.  Review of Systems Per HPI unless specifically indicated above     Objective:    BP 128/86 mmHg  Pulse 76  Temp(Src) 98.6 F (37 C)  Ht 5' 7.2" (1.707 m)  Wt 187 lb 3.2 oz (84.913 kg)  BMI 29.14 kg/m2  SpO2 99%  LMP 03/04/2015 (Exact Date)  Wt Readings from Last 3 Encounters:  03/06/15 187 lb 3.2 oz (84.913 kg)  11/08/14 176 lb (79.833 kg)  10/14/14 179 lb (81.194 kg)    Physical Exam  Constitutional: She appears well-developed and well-nourished. No distress.  Eyes: EOM are normal. No scleral icterus.  Neck: No thyromegaly present.  Cardiovascular: Normal rate and regular rhythm.   Pulmonary/Chest: Effort normal and breath sounds normal.  Musculoskeletal: She exhibits no edema.  Neurological: She displays no  tremor.  Reflex Scores:      Patellar reflexes are 2+ on the right side and 2+ on the left side. Skin:  Mild flushing of the neck when talking about her anxiety  Psychiatric: Her mood appears anxious (mildly anxious, but excellent eye contact with examiner). She is not aggressive and not hyperactive. She does not exhibit a depressed mood.    Results for orders placed or performed in visit on 10/14/14  Hgb A1c w/o eAG  Result Value Ref Range   Hgb A1c MFr Bld 9.6 (H) 4.8 - 5.6 %  Lipid Panel w/o Chol/HDL Ratio  Result Value Ref Range   Cholesterol, Total 189 100 - 199 mg/dL   Triglycerides 409 (H) 0 - 149 mg/dL   HDL 36 (L) >81 mg/dL   VLDL Cholesterol Cal 80 (H) 5 - 40 mg/dL   LDL Calculated 73 0 - 99 mg/dL  Microalbumin / creatinine urine ratio  Result Value Ref Range   Creatinine, Urine 107.5 Not Estab. mg/dL   Microalbum.,U,Random 537.7 Not Estab. ug/mL   MICROALB/CREAT RATIO 500.2 (H) 0.0 - 30.0 mg/g creat  Comprehensive metabolic panel  Result Value Ref Range   Glucose 235 (H) 65 - 99 mg/dL   BUN 11 6 - 24 mg/dL   Creatinine, Ser 1.91 0.57 - 1.00 mg/dL   GFR calc non Af Amer 111 >59 mL/min/1.73  GFR calc Af Amer 128 >59 mL/min/1.73   BUN/Creatinine Ratio 17 9 - 23   Sodium 135 134 - 144 mmol/L   Potassium 4.1 3.5 - 5.2 mmol/L   Chloride 97 97 - 108 mmol/L   CO2 19 18 - 29 mmol/L   Calcium 9.0 8.7 - 10.2 mg/dL   Total Protein 6.8 6.0 - 8.5 g/dL   Albumin 4.3 3.5 - 5.5 g/dL   Globulin, Total 2.5 1.5 - 4.5 g/dL   Albumin/Globulin Ratio 1.7 1.1 - 2.5   Bilirubin Total 0.4 0.0 - 1.2 mg/dL   Alkaline Phosphatase 134 (H) 39 - 117 IU/L   AST 32 0 - 40 IU/L   ALT 37 (H) 0 - 32 IU/L      Assessment & Plan:   Problem List Items Addressed This Visit      Endocrine   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)    She is overdue for labs and evaluation; return in a week or two for fasting labs and visit        Other   Anxiety disorder - Primary     This sounds very episodic; I would be inclined to not use benzos in this patient for a few reasons, including addictive potential, and she has to be able to be alert to care for her special-needs daughter; since the episodes are ones in which she can anticipate the response, she might benefit from low-dose propranolol prior to event; encouraged her to try one at home so she can be prepared to know how she responds; explained that this may blunt hypoglycemic reaction and to always eat well and have glucometer and extra food available if she takes a pill; discussed other options as well including yoga and the relaxation response, see AVS         Follow up plan: Return in about 2 weeks (around 03/20/2015) for thirty minute follow-up with fasting labs.  An after-visit summary was printed and given to the patient at check-out.  Please see the patient instructions which may contain other information and recommendations beyond what is mentioned above in the assessment and plan.  Meds ordered this encounter  Medications  . propranolol (INDERAL) 10 MG tablet    Sig: Take 1 tablet (10 mg total) by mouth every 8 (eight) hours as needed.    Dispense:  20 tablet    Refill:  2

## 2015-03-06 NOTE — Patient Instructions (Signed)
Try meditation or relaxation breathing for your symptoms You have the new medicine to use if needed; first time you take it be at home Make sure if you take the medicine that you eat and don't have low blood sugar  Check out Your Brain on Yoga Author [address] Elyn Aquas, Ph.D   Steps to Elicit the Relaxation Response The following is the technique reprinted with permission from Dr. Billy Fischer book The Relaxation Response pages 162-163 1. Sit quietly in a comfortable position. 2. Close your eyes. 3. Deeply relax all your muscles,  beginning at your feet and progressing up to your face.  Keep them relaxed. 4. Breathe through your nose.  Become aware of your breathing.  As you breathe out, say the word, "one"*,  silently to yourself. For example,  breathe in ... out, "one",- in .. out, "one", etc.  Breathe easily and naturally. 5. Continue for 10 to 20 minutes.  You may open your eyes to check the time, but do not use an alarm.  When you finish, sit quietly for several minutes,  at first with your eyes closed and later with your eyes opened.  Do not stand up for a few minutes. 6. Do not worry about whether you are successful  in achieving a deep level of relaxation.  Maintain a passive attitude and permit relaxation to occur at its own pace.  When distracting thoughts occur,  try to ignore them by not dwelling upon them  and return to repeating "one."  With practice, the response should come with little effort.  Practice the technique once or twice daily,  but not within two hours after any meal,  since the digestive processes seem to interfere with  the elicitation of the Relaxation Response. * It is better to use a soothing, mellifluous sound, preferably with no meaning. or association, to avoid stimulation of unnecessary thoughts - a mantra.

## 2015-03-12 ENCOUNTER — Telehealth: Payer: Self-pay | Admitting: Family Medicine

## 2015-03-12 NOTE — Telephone Encounter (Signed)
Insurance covers tradjenta, [place], jenumet or jentadueto sent to [facility] [address] they just need to know that she has bad reactions to metformin and any other similar meds.

## 2015-03-12 NOTE — Telephone Encounter (Signed)
Routing to provider. See message below, needs medication changed to one listed in message below.

## 2015-03-13 NOTE — Telephone Encounter (Signed)
Patient was calling to check on the status of this, she has been without med for a while.

## 2015-03-14 MED ORDER — LINAGLIPTIN 5 MG PO TABS: 5.0000 mg | ORAL_TABLET | Freq: Every day | ORAL | Status: DC

## 2015-03-14 NOTE — Telephone Encounter (Signed)
Patient notified

## 2015-03-14 NOTE — Telephone Encounter (Signed)
We'll use tradjenta, Rx sent See allergy list

## 2015-03-16 DIAGNOSIS — F419 Anxiety disorder, unspecified: Secondary | ICD-10-CM | POA: Insufficient documentation

## 2015-03-16 NOTE — Assessment & Plan Note (Signed)
She is overdue for labs and evaluation; return in a week or two for fasting labs and visit

## 2015-03-16 NOTE — Assessment & Plan Note (Signed)
This sounds very episodic; I would be inclined to not use benzos in this patient for a few reasons, including addictive potential, and she has to be able to be alert to care for her special-needs daughter; since the episodes are ones in which she can anticipate the response, she might benefit from low-dose propranolol prior to event; encouraged her to try one at home so she can be prepared to know how she responds; explained that this may blunt hypoglycemic reaction and to always eat well and have glucometer and extra food available if she takes a pill; discussed other options as well including yoga and the relaxation response, see AVS

## 2015-03-18 ENCOUNTER — Other Ambulatory Visit: Payer: Self-pay

## 2015-03-20 ENCOUNTER — Telehealth: Payer: Self-pay | Admitting: Family Medicine

## 2015-03-20 NOTE — Telephone Encounter (Signed)
[employer] called stated Onglyza is part of a set of a specific therapy and the generics have to be tried first. Stated pt's medication can be changed to [place], but this would also require a PA because it is also part of a set therapy program. Also need to notify when calling for the PA that Metformin has caused issues with the patient in the past. Please call provider line to inform, number included above. Thanks.

## 2015-03-21 NOTE — Telephone Encounter (Signed)
Keri, can you look into this? I know you've been dealing with it.

## 2015-03-24 ENCOUNTER — Other Ambulatory Visit: Payer: Self-pay | Admitting: Family Medicine

## 2015-03-24 MED ORDER — FLUCONAZOLE 150 MG PO TABS: 150.0000 mg | ORAL_TABLET | Freq: Once | ORAL | Status: DC

## 2015-03-27 ENCOUNTER — Ambulatory Visit: Payer: 59 | Admitting: Family Medicine

## 2015-07-10 ENCOUNTER — Ambulatory Visit (INDEPENDENT_AMBULATORY_CARE_PROVIDER_SITE_OTHER): Payer: BLUE CROSS/BLUE SHIELD | Admitting: Family Medicine

## 2015-07-10 ENCOUNTER — Encounter: Payer: Self-pay | Admitting: Family Medicine

## 2015-07-10 VITALS — BP 126/82 | HR 90 | Temp 98.4°F | Resp 16 | Wt 173.0 lb

## 2015-07-10 DIAGNOSIS — K760 Fatty (change of) liver, not elsewhere classified: Secondary | ICD-10-CM | POA: Diagnosis not present

## 2015-07-10 DIAGNOSIS — Z794 Long term (current) use of insulin: Secondary | ICD-10-CM

## 2015-07-10 DIAGNOSIS — I10 Essential (primary) hypertension: Secondary | ICD-10-CM

## 2015-07-10 DIAGNOSIS — R748 Abnormal levels of other serum enzymes: Secondary | ICD-10-CM | POA: Diagnosis not present

## 2015-07-10 DIAGNOSIS — R809 Proteinuria, unspecified: Secondary | ICD-10-CM

## 2015-07-10 DIAGNOSIS — E785 Hyperlipidemia, unspecified: Secondary | ICD-10-CM

## 2015-07-10 DIAGNOSIS — E1165 Type 2 diabetes mellitus with hyperglycemia: Secondary | ICD-10-CM | POA: Diagnosis not present

## 2015-07-10 DIAGNOSIS — N898 Other specified noninflammatory disorders of vagina: Secondary | ICD-10-CM | POA: Diagnosis not present

## 2015-07-10 DIAGNOSIS — F4321 Adjustment disorder with depressed mood: Secondary | ICD-10-CM | POA: Diagnosis not present

## 2015-07-10 DIAGNOSIS — E1129 Type 2 diabetes mellitus with other diabetic kidney complication: Secondary | ICD-10-CM

## 2015-07-10 DIAGNOSIS — IMO0002 Reserved for concepts with insufficient information to code with codable children: Secondary | ICD-10-CM

## 2015-07-10 DIAGNOSIS — E1121 Type 2 diabetes mellitus with diabetic nephropathy: Secondary | ICD-10-CM | POA: Diagnosis not present

## 2015-07-10 MED ORDER — METRONIDAZOLE 500 MG PO TABS: 500.0000 mg | ORAL_TABLET | Freq: Two times a day (BID) | ORAL | Status: AC

## 2015-07-10 MED ORDER — ESCITALOPRAM OXALATE 10 MG PO TABS: 10.0000 mg | ORAL_TABLET | Freq: Every day | ORAL | Status: DC

## 2015-07-10 MED ORDER — FLUCONAZOLE 150 MG PO TABS: 150.0000 mg | ORAL_TABLET | Freq: Once | ORAL | Status: DC

## 2015-07-10 NOTE — Assessment & Plan Note (Signed)
Check lipids (fasting) 

## 2015-07-10 NOTE — Assessment & Plan Note (Signed)
Patient has chronically ill child with epilepsy, and patient is depressed and tearful; discussed; start escitalopram; close f/u

## 2015-07-10 NOTE — Assessment & Plan Note (Signed)
Check LFTs 

## 2015-07-10 NOTE — Assessment & Plan Note (Addendum)
Wet mount collected, and patient has both clue cells and hyphal fragments under the microscope; start med; I suspect the BV may be from stress and explained this, and that the yeast is from uncontrolled diabetes

## 2015-07-10 NOTE — Progress Notes (Signed)
BP 126/82 mmHg  Pulse 90  Temp(Src) 98.4 F (36.9 C) (Oral)  Resp 16  Wt 173 lb (78.472 kg)  SpO2 98%  LMP 06/25/2015 (Approximate)   Subjective:    Patient ID: Monica Sims, female    DOB: [birth year], 40 y.o.   MRN: [id number]  HPI: Monica Sims is a 40 y.o. female  Chief Complaint  Patient presents with  . Vaginitis    discharge and itching   She thinks has another yeast infection; she used the cream, but it does nothing for it; the pill relieves it for a day or two Lots of stress; daughter having seizures and neurologist gave them rectal diazepam Itching down below, white cheesy discharge No fevers; no enlarged lymph nodes Monogamous Burning with urination, stings on the outside, no pelvic discomfort Worse in the mornings She has type 2 diabetes, sugars are "about the same", 200s; she has not had her A1c checked since last August Not seeing an endocrinologist now, did see one once but has not had any regular f/u for her diabetes in many months She is under significant stress with her daughter who has epilepsy; no thoughts of self harm or hurting others  Depression screen PHQ 2/9 07/10/2015  Decreased Interest 0  Down, Depressed, Hopeless 0  PHQ - 2 Score 0   Relevant past medical, surgical, family and social history reviewed Past Medical History  Diagnosis Date  . Hyperlipidemia   . Hypertension   . Diabetes mellitus without complication (HCC)     with renal complications  . Elevated liver enzymes   . Hepatic steatosis   . Overweight   . CKD (chronic kidney disease) stage 1, GFR 90 ml/min or greater   . 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 History of pre-eclampsia   . Gestational diabetes    Past Surgical History  Procedure Laterality Date  . Wisdom tooth extraction     Family History  Problem Relation Age of Onset  . Cancer Mother   . Heart attack Father   . Heart disease Father   . Hypertension Father    Social History    Substance Use Topics  . Smoking status: Former Smoker -- 0.50 packs/day for 5 years    Types: Cigarettes    Quit date: 09/15/2008  . Smokeless tobacco: Never Used  . Alcohol Use: No   Interim medical history since last visit reviewed. Allergies and medications reviewed  Review of Systems Per HPI unless specifically indicated above     Objective:    BP 126/82 mmHg  Pulse 90  Temp(Src) 98.4 F (36.9 C) (Oral)  Resp 16  Wt 173 lb (78.472 kg)  SpO2 98%  LMP 06/25/2015 (Approximate)  Wt Readings from Last 3 Encounters:  07/10/15 173 lb (78.472 kg)  03/06/15 187 lb 3.2 oz (84.913 kg)  11/08/14 176 lb (79.833 kg)    Physical Exam  Constitutional: She appears well-developed and well-nourished.  Weight loss noted, 14 pounds in 4 months  Cardiovascular: Normal rate.   Pulmonary/Chest: Effort normal.  Abdominal: Soft. She exhibits no distension.  Genitourinary: No bleeding in the vagina. Vaginal discharge (thin watery, slight odor) found.  Labia slightly erythematous, slightly lichenified consistent with scratching; no evidence of injury  Musculoskeletal: She exhibits no edema.  Neurological: She is alert.  Skin: Skin is warm. No pallor.  Psychiatric: Her mood appears not anxious. Her affect is not blunt. Her speech is not rapid and/or pressured and not delayed. She  is not agitated and not withdrawn. Cognition and memory are not impaired. She does not express impulsivity or inappropriate judgment. She exhibits a depressed mood (briefly tearful). She expresses no homicidal and no suicidal ideation.  Good eye contact with examiner She is attentive.    Results for orders placed or performed in visit on 10/14/14  Hgb A1c w/o eAG  Result Value Ref Range   Hgb A1c MFr Bld 9.6 (H) 4.8 - 5.6 %  Lipid Panel w/o Chol/HDL Ratio  Result Value Ref Range   Cholesterol, Total 189 100 - 199 mg/dL   Triglycerides 161 (H) 0 - 149 mg/dL   HDL 36 (L) >09 mg/dL   VLDL Cholesterol Cal 80 (H) 5  - 40 mg/dL   LDL Calculated 73 0 - 99 mg/dL  Microalbumin / creatinine urine ratio  Result Value Ref Range   Creatinine, Urine 107.5 Not Estab. mg/dL   Microalbum.,U,Random 537.7 Not Estab. ug/mL   MICROALB/CREAT RATIO 500.2 (H) 0.0 - 30.0 mg/g creat  Comprehensive metabolic panel  Result Value Ref Range   Glucose 235 (H) 65 - 99 mg/dL   BUN 11 6 - 24 mg/dL   Creatinine, Ser 6.04 0.57 - 1.00 mg/dL   GFR calc non Af Amer 111 >59 mL/min/1.73   GFR calc Af Amer 128 >59 mL/min/1.73   BUN/Creatinine Ratio 17 9 - 23   Sodium 135 134 - 144 mmol/L   Potassium 4.1 3.5 - 5.2 mmol/L   Chloride 97 97 - 108 mmol/L   CO2 19 18 - 29 mmol/L   Calcium 9.0 8.7 - 10.2 mg/dL   Total Protein 6.8 6.0 - 8.5 g/dL   Albumin 4.3 3.5 - 5.5 g/dL   Globulin, Total 2.5 1.5 - 4.5 g/dL   Albumin/Globulin Ratio 1.7 1.1 - 2.5   Bilirubin Total 0.4 0.0 - 1.2 mg/dL   Alkaline Phosphatase 134 (H) 39 - 117 IU/L   AST 32 0 - 40 IU/L   ALT 37 (H) 0 - 32 IU/L      Assessment & Plan:   Problem List Items Addressed This Visit      Cardiovascular and Mediastinum   Hypertension    Controlled today        Digestive   Hepatic steatosis    Check LFTs      Relevant Orders   Comprehensive metabolic panel     Endocrine   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)    Check A1c today; encouraged her to return for dedicated diabetes visit in the next 1-2 weeks; with her weight loss and yeast infections, I suspect that her diabetes is way out of control, and I am entering a referral to endocrinologist today without even waiting for labs to come back; she has been lost to f/u with me, and has no labs for her diabetes since last August; labs drawn today even though she is here for just yeast infection, but I fully expect her numbers to be out of control      Relevant Orders   Hemoglobin A1c   Lipid Panel w/o Chol/HDL Ratio   Microalbumin / creatinine urine ratio   Ambulatory referral to  Endocrinology     Genitourinary   Microalbuminuria due to type 2 diabetes mellitus (HCC)    Not on ACE-I because she and her husband were going to try to conceive      Relevant Orders   Microalbumin / creatinine urine ratio     Other  Adjustment disorder with depressed mood    Patient has chronically ill child with epilepsy, and patient is depressed and tearful; discussed; start escitalopram; close f/u      Elevated liver enzymes    Check LFTs      Relevant Orders   Comprehensive metabolic panel   Hyperlipidemia    Check lipids fasting      Relevant Orders   Lipid Panel w/o Chol/HDL Ratio   Vaginal discharge - Primary    Wet mount collected, and patient has both clue cells and hyphal fragments under the microscope; start med; I suspect the BV may be from stress and explained this, and that the yeast is from uncontrolled diabetes          Follow up plan: Return 1-2 weeks, for diabetes.  An after-visit summary was printed and given to the patient at check-out.  Please see the patient instructions which may contain other information and recommendations beyond what is mentioned above in the assessment and plan.  Meds ordered this encounter  Medications  . escitalopram (LEXAPRO) 10 MG tablet    Sig: Take 1 tablet (10 mg total) by mouth daily.    Dispense:  30 tablet    Refill:  0  . metroNIDAZOLE (FLAGYL) 500 MG tablet    Sig: Take 1 tablet (500 mg total) by mouth 2 (two) times daily. No alcohol or cold medicines that contain alcohol while on this med    Dispense:  14 tablet    Refill:  0  . fluconazole (DIFLUCAN) 150 MG tablet    Sig: Take 1 tablet (150 mg total) by mouth once.    Dispense:  1 tablet    Refill:  0    Orders Placed This Encounter  Procedures  . Hemoglobin A1c  . Comprehensive metabolic panel  . Lipid Panel w/o Chol/HDL Ratio  . Microalbumin / creatinine urine ratio  . Ambulatory referral to Endocrinology

## 2015-07-10 NOTE — Patient Instructions (Addendum)
Let's get labs fasting this week or next Start the new medicine to help alleviate stressful reactions Return in 1-2 weeks for dedicated diabetes visit Let's consider having you see an endocrinologist to get your diabetes under control; I expect that your numbers are going to be high, so I'll enter the referral now and get that going

## 2015-07-10 NOTE — Assessment & Plan Note (Addendum)
Check A1c today; encouraged her to return for dedicated diabetes visit in the next 1-2 weeks; with her weight loss and yeast infections, I suspect that her diabetes is way out of control, and I am entering a referral to endocrinologist today without even waiting for labs to come back; she has been lost to f/u with me, and has no labs for her diabetes since last August; labs drawn today even though she is here for just yeast infection, but I fully expect her numbers to be out of control

## 2015-07-10 NOTE — Assessment & Plan Note (Signed)
Not on ACE-I because she and her husband were going to try to conceive

## 2015-07-10 NOTE — Assessment & Plan Note (Signed)
Controlled today 

## 2015-07-16 ENCOUNTER — Other Ambulatory Visit: Payer: Self-pay

## 2015-07-16 MED ORDER — LINAGLIPTIN 5 MG PO TABS: 5.0000 mg | ORAL_TABLET | Freq: Every day | ORAL | Status: DC

## 2015-07-25 ENCOUNTER — Ambulatory Visit: Payer: BLUE CROSS/BLUE SHIELD | Admitting: Family Medicine

## 2015-07-28 ENCOUNTER — Ambulatory Visit: Payer: 59 | Admitting: Family Medicine

## 2015-08-14 ENCOUNTER — Other Ambulatory Visit: Payer: Self-pay | Admitting: Family Medicine

## 2015-08-14 NOTE — Telephone Encounter (Signed)
We had hoped patient would have returned for her diabetes by now; please call patient and encourage her to schedule an appt for her diabetes; remind her that she is overdue for labs; thank you

## 2015-08-14 NOTE — Telephone Encounter (Signed)
LMOM for pt to call the office and schedule an appt

## 2015-11-10 ENCOUNTER — Encounter: Payer: Self-pay | Admitting: Family Medicine

## 2016-03-19 IMAGING — US US GALLBLADDER-BILIARY (RUQ)
1 series · 14 of 25 positions shown · non-contrast
Comparison: 11/20/2012

CLINICAL DATA: Elevated LFTs

EXAM:
US ABDOMEN LIMITED - RIGHT UPPER QUADRANT

[Series 1: us gallbladder-biliary (ruq) · 0.31mm/px · 14 of 59 slices shown]
[im 1/59]
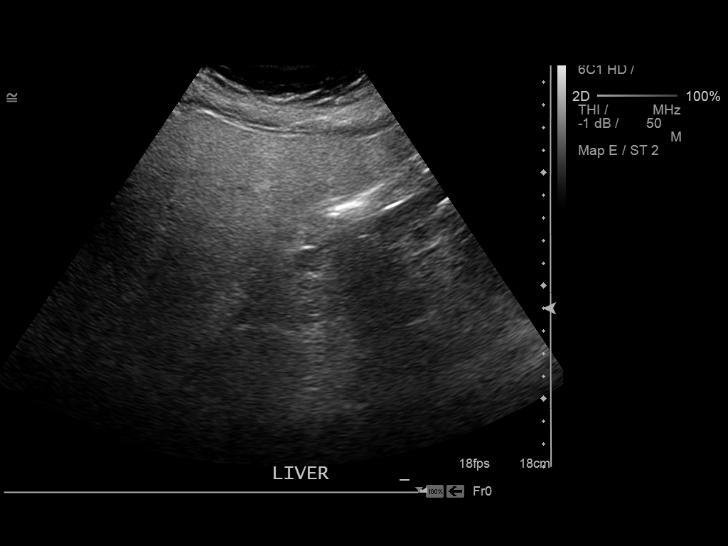
[im 5/59]
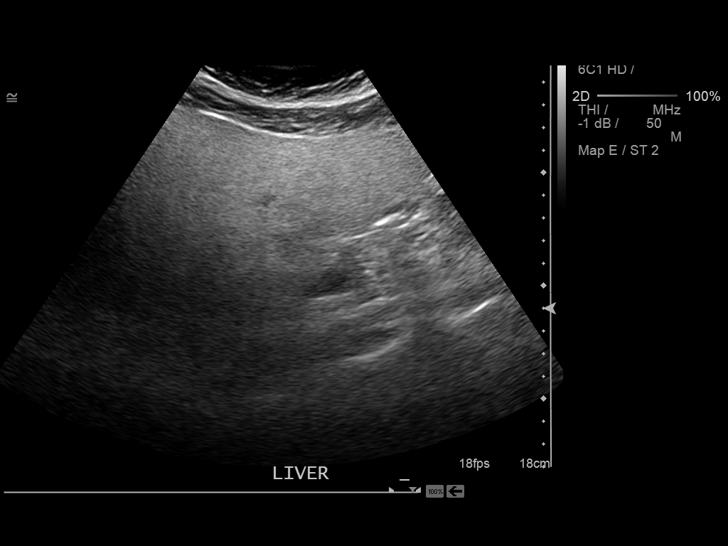
[im 10/59]
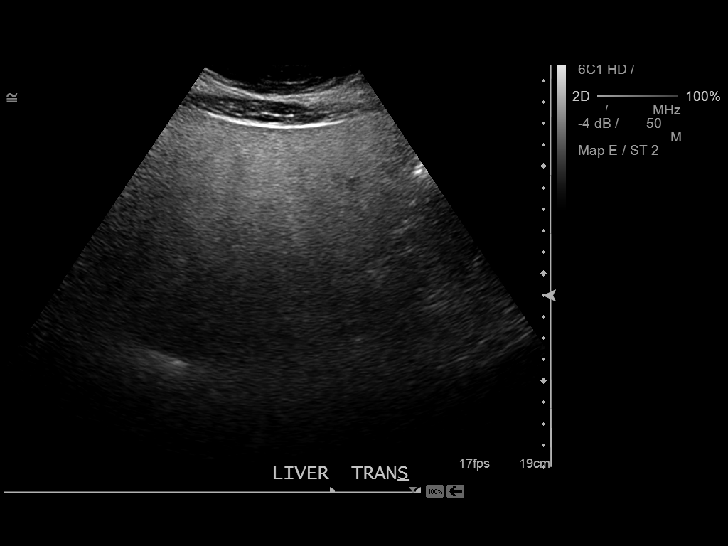
[im 15/59]
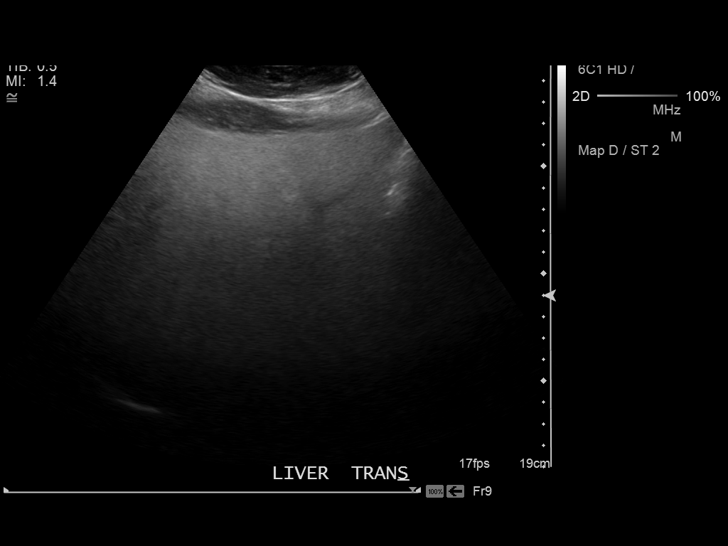
[im 20/59]
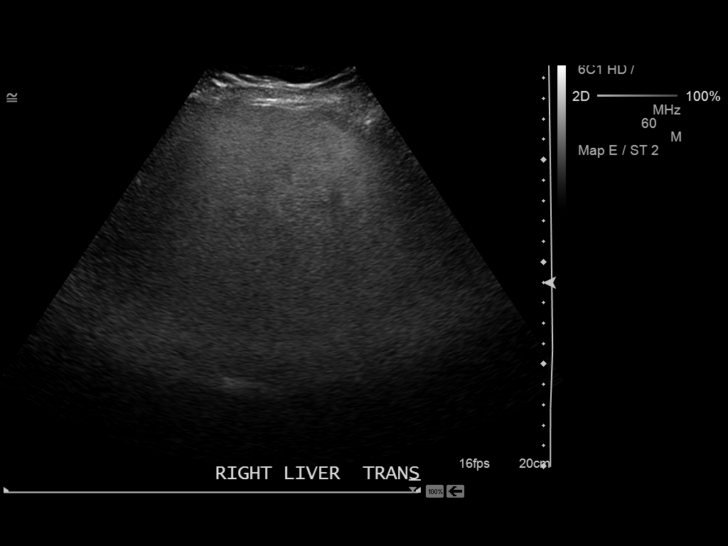
[im 22/59]
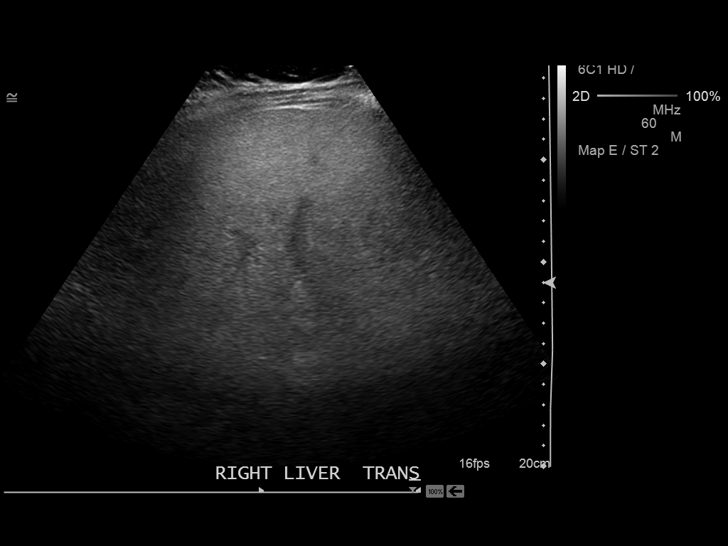
[im 27/59]
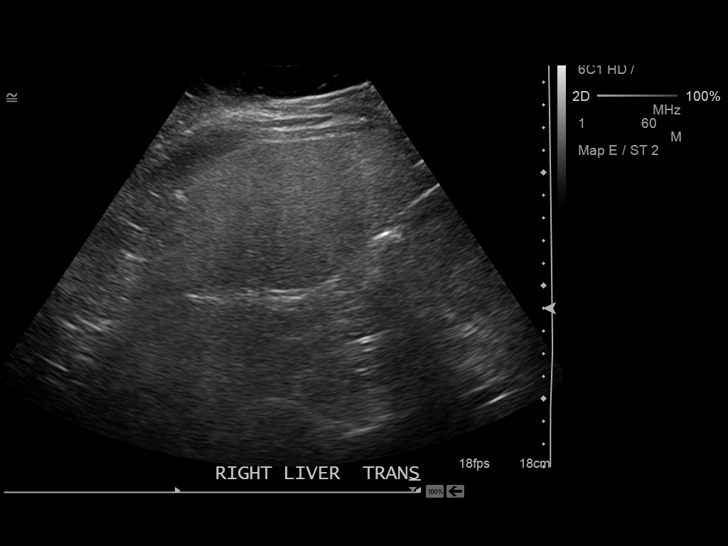
[im 32/59]
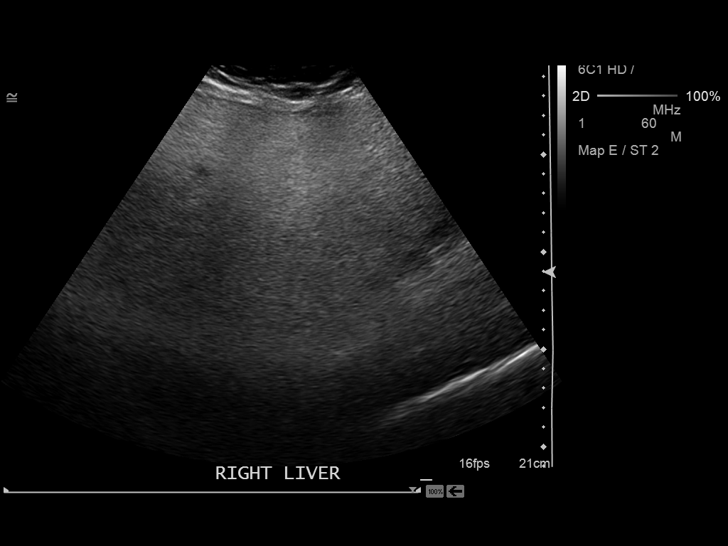
[im 37/59]
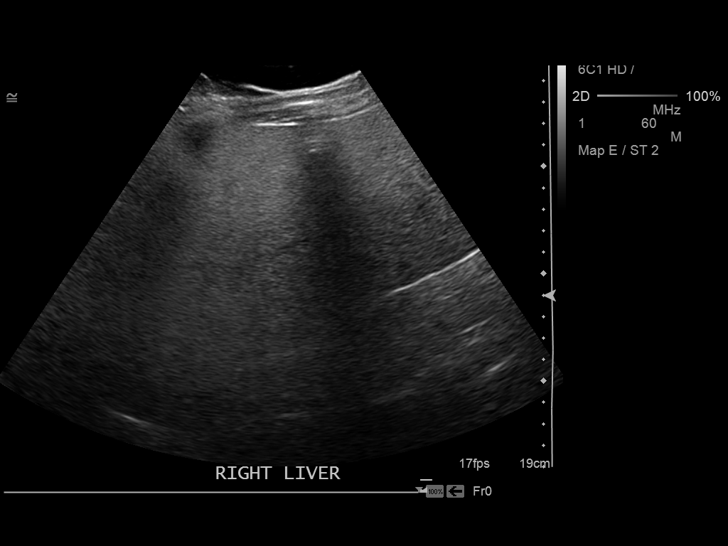
[im 39/59]
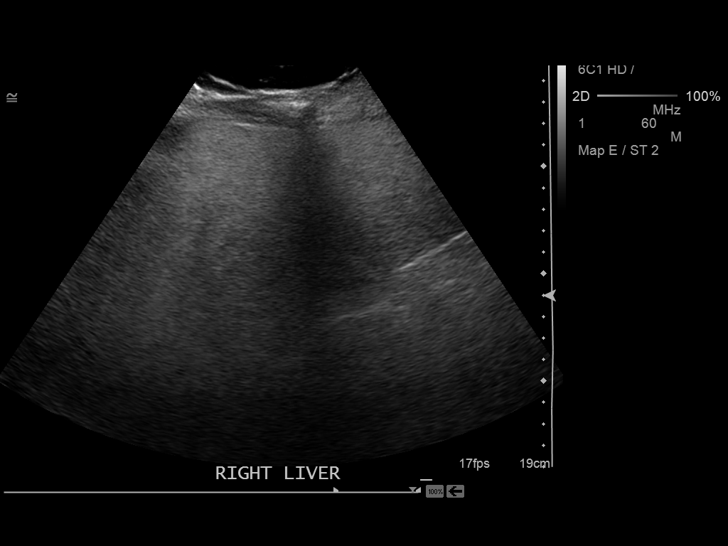
[im 44/59]
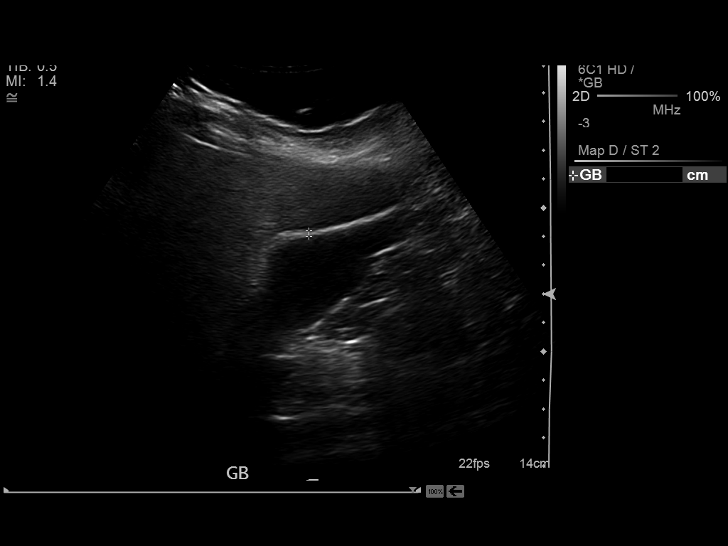
[im 49/59]
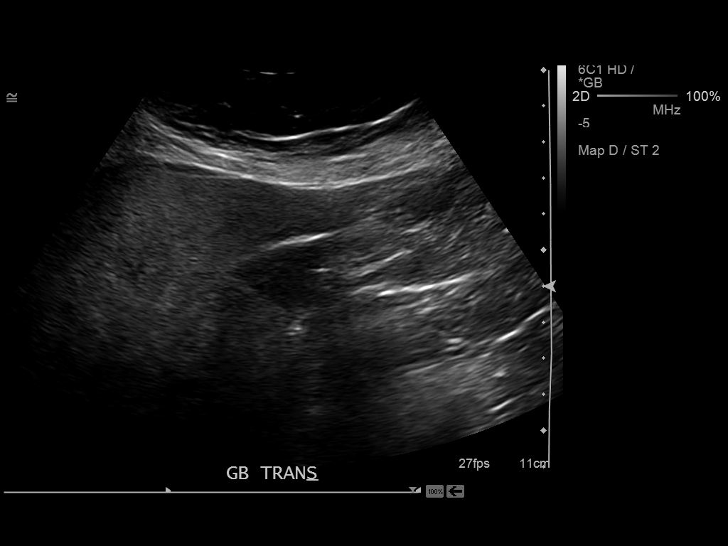
[im 54/59]
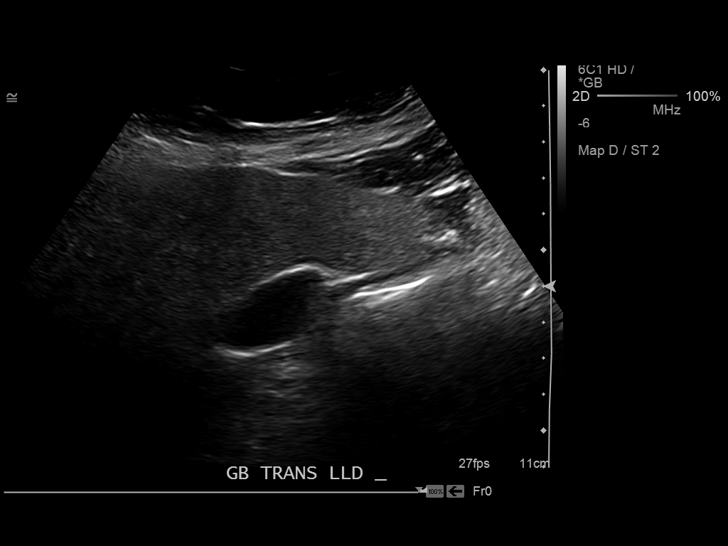
[im 59/59]
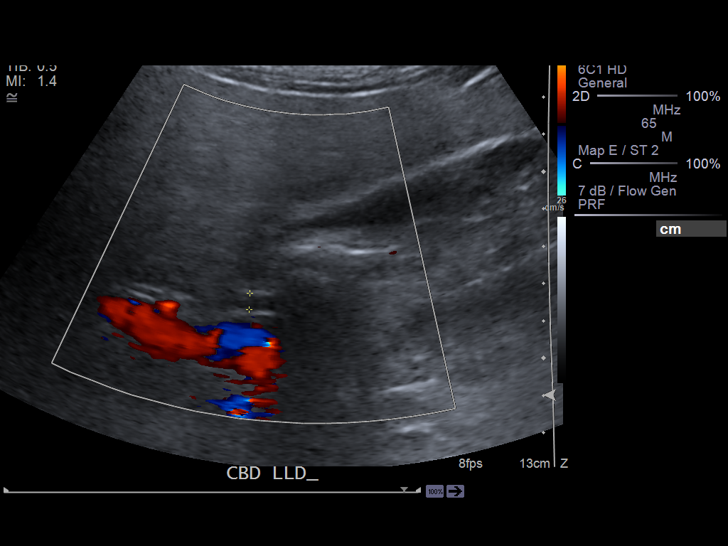

[14 of 25 positions shown; findings below may reference images not displayed]

FINDINGS: Gallbladder:

No gallstones or wall thickening visualized. No sonographic Murphy
sign noted.

Common bile duct:

Diameter: 4.4 mm within normal limits.

Liver:

No focal lesion identified. Again noted fatty infiltration of the
liver with mild hepatomegaly. Liver measures 24.3 cm in length.
IMPRESSION: No gallstones. Normal CBD. Fatty infiltration of the liver and mild
hepatomegaly.

## 2016-03-23 ENCOUNTER — Ambulatory Visit: Payer: BLUE CROSS/BLUE SHIELD | Admitting: Family Medicine

## 2016-04-02 ENCOUNTER — Ambulatory Visit (INDEPENDENT_AMBULATORY_CARE_PROVIDER_SITE_OTHER): Payer: BLUE CROSS/BLUE SHIELD | Admitting: Family Medicine

## 2016-04-02 ENCOUNTER — Encounter: Payer: Self-pay | Admitting: Family Medicine

## 2016-04-02 VITALS — BP 149/101 | HR 90 | Temp 98.0°F | Resp 17 | Ht 67.2 in | Wt 167.0 lb

## 2016-04-02 DIAGNOSIS — B9689 Other specified bacterial agents as the cause of diseases classified elsewhere: Secondary | ICD-10-CM

## 2016-04-02 DIAGNOSIS — I10 Essential (primary) hypertension: Secondary | ICD-10-CM | POA: Diagnosis not present

## 2016-04-02 DIAGNOSIS — B379 Candidiasis, unspecified: Secondary | ICD-10-CM

## 2016-04-02 DIAGNOSIS — K760 Fatty (change of) liver, not elsewhere classified: Secondary | ICD-10-CM

## 2016-04-02 DIAGNOSIS — N898 Other specified noninflammatory disorders of vagina: Secondary | ICD-10-CM | POA: Diagnosis not present

## 2016-04-02 DIAGNOSIS — E1129 Type 2 diabetes mellitus with other diabetic kidney complication: Secondary | ICD-10-CM

## 2016-04-02 DIAGNOSIS — E782 Mixed hyperlipidemia: Secondary | ICD-10-CM | POA: Diagnosis not present

## 2016-04-02 DIAGNOSIS — E1165 Type 2 diabetes mellitus with hyperglycemia: Secondary | ICD-10-CM

## 2016-04-02 DIAGNOSIS — F411 Generalized anxiety disorder: Secondary | ICD-10-CM

## 2016-04-02 DIAGNOSIS — N760 Acute vaginitis: Secondary | ICD-10-CM | POA: Diagnosis not present

## 2016-04-02 DIAGNOSIS — IMO0002 Reserved for concepts with insufficient information to code with codable children: Secondary | ICD-10-CM

## 2016-04-02 DIAGNOSIS — R809 Proteinuria, unspecified: Secondary | ICD-10-CM

## 2016-04-02 DIAGNOSIS — Z794 Long term (current) use of insulin: Secondary | ICD-10-CM

## 2016-04-02 DIAGNOSIS — R748 Abnormal levels of other serum enzymes: Secondary | ICD-10-CM

## 2016-04-02 DIAGNOSIS — E1121 Type 2 diabetes mellitus with diabetic nephropathy: Secondary | ICD-10-CM

## 2016-04-02 LAB — WET PREP FOR TRICH, YEAST, CLUE
CLUE CELL EXAM: POSITIVE — AB
TRICHOMONAS EXAM: NEGATIVE
YEAST EXAM: POSITIVE — AB

## 2016-04-02 MED ORDER — PROPRANOLOL HCL 10 MG PO TABS
10.0000 mg | ORAL_TABLET | Freq: Three times a day (TID) | ORAL | 2 refills | Status: DC | PRN
Start: 2016-04-02 — End: 2016-07-22

## 2016-04-02 MED ORDER — FLUCONAZOLE 150 MG PO TABS: 150.0000 mg | ORAL_TABLET | Freq: Once | ORAL | 0 refills | Status: AC

## 2016-04-02 MED ORDER — INSULIN GLARGINE 100 UNIT/ML SOLOSTAR PEN: 20.0000 [IU] | PEN_INJECTOR | Freq: Every day | SUBCUTANEOUS | 11 refills | Status: DC

## 2016-04-02 MED ORDER — ESCITALOPRAM OXALATE 10 MG PO TABS: ORAL_TABLET | ORAL | 3 refills | Status: DC

## 2016-04-02 MED ORDER — AMLODIPINE BESYLATE 10 MG PO TABS: 5.0000 mg | ORAL_TABLET | Freq: Every day | ORAL | 3 refills | Status: DC

## 2016-04-02 MED ORDER — METRONIDAZOLE 500 MG PO TABS: 500.0000 mg | ORAL_TABLET | Freq: Two times a day (BID) | ORAL | 0 refills | Status: DC

## 2016-04-02 NOTE — Assessment & Plan Note (Signed)
Rechecking levels today. Await results.  

## 2016-04-02 NOTE — Progress Notes (Signed)
BP (!) 149/101 (BP Location: Left Arm, Patient Position: Sitting, Cuff Size: Normal)   Pulse 90   Temp 98 F (36.7 C) (Oral)   Resp 17   Ht 5' 7.2" (1.707 m)   Wt 167 lb (75.8 kg)   LMP 03/18/2016   SpO2 99%   BMI 26.00 kg/m    Subjective:    Patient ID: Monica Sims, female    DOB: [birth year], 41 y.o.   MRN: [id number]  HPI: Monica Sims is a 41 y.o. female who presents today to reestablish care. She has been out of her medicine for several weeks due to losing her insurance, but she just got it back and needs to get back on all her medicines.  Chief Complaint  Patient presents with  . Re establish care   HYPERTENSION / HYPERLIPIDEMIA Satisfied with current treatment? no Duration of hypertension: chronic BP monitoring frequency: not checking BP medication side effects: no Past BP meds: amlodipine Duration of hyperlipidemia: chronic Cholesterol medication side effects: Not on anything Cholesterol supplements: fish oil Past cholesterol medications: fish oil Medication compliance: excellent compliance Aspirin: no Recent stressors: yes Recurrent headaches: no Visual changes: no Palpitations: no Dyspnea: no Chest pain: no Lower extremity edema: no Dizzy/lightheaded: no  ANXIETY/STRESS Duration:exacerbated Anxious mood: yes  Excessive worrying: yes Irritability: no  Sweating: no Nausea: no Palpitations:no Hyperventilation: no Panic attacks: yes Agoraphobia: yes  Obscessions/compulsions: no Depressed mood: yes Depression screen [facility] 2/9 04/02/2016 07/10/2015  Decreased Interest 1 0  Down, Depressed, Hopeless 2 0  PHQ - 2 Score 3 0  Altered sleeping 0 -  Tired, decreased energy 0 -  Change in appetite 1 -  Feeling bad or failure about yourself  0 -  Trouble concentrating 2 -  Moving slowly or fidgety/restless 0 -  Suicidal thoughts 0 -  PHQ-9 Score 6 -   GAD 7 : Generalized Anxiety Score 04/02/2016  Nervous, Anxious, on Edge 2  Control/stop  worrying 2  Worry too much - different things 2  Trouble relaxing 1  Restless 0  Easily annoyed or irritable 1  Afraid - awful might happen 0  Total GAD 7 Score 8  Anxiety Difficulty Somewhat difficult    Anhedonia: no Weight changes: yes Insomnia: no   Hypersomnia: yes Fatigue/loss of energy: yes Feelings of worthlessness: yes Feelings of guilt: yes Impaired concentration/indecisiveness: yes Suicidal ideations: no  Crying spells: yes Recent Stressors/Life Changes: yes   Relationship problems: no   Family stress: yes    DIABETES Hypoglycemic episodes:no Polydipsia/polyuria: no Visual disturbance: yes Chest pain: no Paresthesias: no Glucose Monitoring: no Taking Insulin?: no Blood Pressure Monitoring: not checking   Active Ambulatory Problems    Diagnosis Date Noted  . Hyperlipidemia   . Hypertension   . 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)   . Elevated liver enzymes   . Hepatic steatosis   . CKD (chronic kidney disease) stage 1, GFR 90 ml/min or greater   . Microalbuminuria due to type 2 diabetes mellitus (HCC) 11/08/2014  . Overweight (BMI 25.0-29.9) 11/08/2014  . Encounter for preconception consultation 11/10/2014  . Anxiety disorder 03/16/2015  . Adjustment disorder with depressed mood 07/10/2015   Resolved Ambulatory Problems    Diagnosis Date Noted  . Vaginal discharge 07/10/2015   Past Medical History:  Diagnosis Date  . CKD (chronic kidney disease) stage 1, GFR 90 ml/min or greater   . Diabetes mellitus without complication (HCC)   . Elevated liver enzymes   .  Gestational diabetes   . Hepatic steatosis   . Hereditary hemochromatosis (HCC) 2015  . History of pre-eclampsia   . Hyperlipidemia   . Hypertension   . Overweight    Past Surgical History:  Procedure Laterality Date  . WISDOM TOOTH EXTRACTION     No medication comments found. Allergies  Allergen Reactions  . Metformin And Related Nausea Only   .  Review  of Systems  Constitutional: Negative.   Respiratory: Negative.   Cardiovascular: Negative.   Psychiatric/Behavioral: Positive for dysphoric mood. Negative for agitation, behavioral problems, confusion, decreased concentration, hallucinations, self-injury, sleep disturbance and suicidal ideas. The patient is nervous/anxious. The patient is not hyperactive.     Per HPI unless specifically indicated above     Objective:    BP (!) 149/101 (BP Location: Left Arm, Patient Position: Sitting, Cuff Size: Normal)   Pulse 90   Temp 98 F (36.7 C) (Oral)   Resp 17   Ht 5' 7.2" (1.707 m)   Wt 167 lb (75.8 kg)   LMP 03/18/2016   SpO2 99%   BMI 26.00 kg/m   Wt Readings from Last 3 Encounters:  04/02/16 167 lb (75.8 kg)  07/10/15 173 lb (78.5 kg)  03/06/15 187 lb 3.2 oz (84.9 kg)    Physical Exam  Constitutional: She is oriented to person, place, and time. She appears well-developed and well-nourished. No distress.  HENT:  Head: Normocephalic and atraumatic.  Right Ear: Hearing normal.  Left Ear: Hearing normal.  Nose: Nose normal.  Eyes: Conjunctivae and lids are normal. Right eye exhibits no discharge. Left eye exhibits no discharge. No scleral icterus.  Cardiovascular: Normal rate, regular rhythm, normal heart sounds and intact distal pulses.  Exam reveals no gallop and no friction rub.   No murmur heard. Pulmonary/Chest: Effort normal and breath sounds normal. No respiratory distress. She has no wheezes. She has no rales. She exhibits no tenderness.  Musculoskeletal: Normal range of motion.  Neurological: She is alert and oriented to person, place, and time.  Skin: Skin is warm and intact. No rash noted. She is not diaphoretic. No erythema. No pallor.  Psychiatric: She has a normal mood and affect. Her speech is normal and behavior is normal. Judgment and thought content normal. Cognition and memory are normal.  Nursing note and vitals reviewed.   Results for orders placed or  performed in visit on 04/02/16  WET PREP FOR TRICH, YEAST, CLUE  Result Value Ref Range   Trichomonas Exam Negative Negative   Yeast Exam Positive (A) Negative   Clue Cell Exam Positive (A) Negative      Assessment & Plan:   Problem List Items Addressed This Visit      Cardiovascular and Mediastinum   Hypertension - Primary    Will ger her restarted on her amlodipine. Call with any concerns. Recheck 3 weeks.       Relevant Medications   propranolol (INDERAL) 10 MG tablet   amLODipine (NORVASC) 10 MG tablet   Other Relevant Orders   CBC with Differential/Platelet   Comprehensive metabolic panel   Microalbumin, Urine Waived   UA/M w/rflx Culture, Routine     Digestive   Hepatic steatosis    Rechecking labs today. Await results.       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 Will call her on Tuesday to  see how her sugars are running to further adjust her insulin. Call with any concerns.       Relevant Medications   Insulin Glargine (LANTUS SOLOSTAR) 100 UNIT/ML Solostar Pen   Other Relevant Orders   Bayer DCA Hb A1c Waived   CBC with Differential/Platelet   Comprehensive metabolic panel   Microalbumin, Urine Waived   UA/M w/rflx Culture, Routine   Microalbuminuria due to type 2 diabetes mellitus (HCC)    Off lisinopril due to desiring conception.       Relevant Medications   Insulin Glargine (LANTUS SOLOSTAR) 100 UNIT/ML Solostar Pen     Other   Hyperlipidemia    Levels good today. Continue to monitor.       Relevant Medications   propranolol (INDERAL) 10 MG tablet   amLODipine (NORVASC) 10 MG tablet   Other Relevant Orders   CBC with Differential/Platelet   Comprehensive metabolic panel   Lipid Panel Piccolo, Waived   Elevated liver enzymes    Rechecking levels today. Await results.       Relevant Orders    CBC with Differential/Platelet   Comprehensive metabolic panel   Anxiety disorder    Will get her restarted on her lexapro. Recheck 3-4 weeks.       Relevant Orders   CBC with Differential/Platelet   Comprehensive metabolic panel   TSH    Other Visit Diagnoses    Vaginal discharge       +BV and yeast   Relevant Orders   WET PREP FOR TRICH, YEAST, CLUE (Completed)   BV (bacterial vaginosis)       Will treat with metronidazole. Call with any concerns.    Relevant Medications   metroNIDAZOLE (FLAGYL) 500 MG tablet   fluconazole (DIFLUCAN) 150 MG tablet   Yeast infection       Will treat with diflucan. Call with any concerns.    Relevant Medications   metroNIDAZOLE (FLAGYL) 500 MG tablet   fluconazole (DIFLUCAN) 150 MG tablet       Follow up plan: Return in about 3 weeks (around 04/23/2016).

## 2016-04-02 NOTE — Assessment & Plan Note (Signed)
Will get her restarted on her lexapro. Recheck 3-4 weeks.

## 2016-04-02 NOTE — Assessment & Plan Note (Signed)
Levels good today. Continue to monitor.

## 2016-04-02 NOTE — Assessment & Plan Note (Signed)
Will restart insulin. Will call her on Tuesday to see how her sugars are running to further adjust her insulin. Call with any concerns.

## 2016-04-02 NOTE — Assessment & Plan Note (Signed)
Rechecking labs today. Await results.  

## 2016-04-02 NOTE — Assessment & Plan Note (Signed)
Off lisinopril due to desiring conception.

## 2016-04-02 NOTE — Assessment & Plan Note (Signed)
Will ger her restarted on her amlodipine. Call with any concerns. Recheck 3 weeks.

## 2016-04-03 LAB — UA/M W/RFLX CULTURE, ROUTINE
BILIRUBIN UA: NEGATIVE
Nitrite, UA: NEGATIVE
SPEC GRAV UA: 1.015 (ref 1.005–1.030)
Urobilinogen, Ur: 0.2 mg/dL (ref 0.2–1.0)
pH, UA: 5.5 (ref 5.0–7.5)

## 2016-04-03 LAB — MICROSCOPIC EXAMINATION: RBC, UA: NONE SEEN /hpf (ref 0–?)

## 2016-04-03 LAB — LIPID PANEL PICCOLO, WAIVED
CHOLESTEROL PICCOLO, WAIVED: 178 mg/dL (ref <200)
Chol/HDL Ratio Piccolo,Waive: 4.5 mg/dL
HDL CHOL PICCOLO, WAIVED: 39 mg/dL — AB (ref >59)
LDL Chol Calc Piccolo Waived: 64 mg/dL (ref <100)
TRIGLYCERIDES PICCOLO,WAIVED: 369 mg/dL — AB (ref <150)
VLDL Chol Calc Piccolo,Waive: 74 mg/dL — ABNORMAL HIGH (ref <30)

## 2016-04-03 LAB — CBC WITH DIFFERENTIAL/PLATELET
BASOS ABS: 0 10*3/uL (ref 0.0–0.2)
Basos: 0 %
EOS (ABSOLUTE): 0.2 10*3/uL (ref 0.0–0.4)
EOS: 2 %
HEMATOCRIT: 43 % (ref 34.0–46.6)
HEMOGLOBIN: 14.6 g/dL (ref 11.1–15.9)
IMMATURE GRANS (ABS): 0 10*3/uL (ref 0.0–0.1)
Immature Granulocytes: 0 %
LYMPHS: 25 %
Lymphocytes Absolute: 1.8 10*3/uL (ref 0.7–3.1)
MCH: 28.6 pg (ref 26.6–33.0)
MCHC: 34 g/dL (ref 31.5–35.7)
MCV: 84 fL (ref 79–97)
MONOCYTES: 4 %
Monocytes Absolute: 0.3 10*3/uL (ref 0.1–0.9)
Neutrophils Absolute: 5.1 10*3/uL (ref 1.4–7.0)
Neutrophils: 69 %
Platelets: 212 10*3/uL (ref 150–379)
RBC: 5.11 x10E6/uL (ref 3.77–5.28)
RDW: 13.5 % (ref 12.3–15.4)
WBC: 7.5 10*3/uL (ref 3.4–10.8)

## 2016-04-03 LAB — COMPREHENSIVE METABOLIC PANEL
ALBUMIN: 4.4 g/dL (ref 3.5–5.5)
ALT: 60 IU/L — ABNORMAL HIGH (ref 0–32)
AST: 35 IU/L (ref 0–40)
Albumin/Globulin Ratio: 2.1 (ref 1.2–2.2)
Alkaline Phosphatase: 123 IU/L — ABNORMAL HIGH (ref 39–117)
BUN/Creatinine Ratio: 18 (ref 9–23)
BUN: 10 mg/dL (ref 6–24)
Bilirubin Total: 0.6 mg/dL (ref 0.0–1.2)
CALCIUM: 9.2 mg/dL (ref 8.7–10.2)
CO2: 20 mmol/L (ref 18–29)
CREATININE: 0.56 mg/dL — AB (ref 0.57–1.00)
Chloride: 97 mmol/L (ref 96–106)
GFR, EST AFRICAN AMERICAN: 133 mL/min/{1.73_m2} (ref >59)
GFR, EST NON AFRICAN AMERICAN: 115 mL/min/{1.73_m2} (ref >59)
GLUCOSE: 294 mg/dL — AB (ref 65–99)
Globulin, Total: 2.1 g/dL (ref 1.5–4.5)
Potassium: 3.9 mmol/L (ref 3.5–5.2)
Sodium: 136 mmol/L (ref 134–144)
TOTAL PROTEIN: 6.5 g/dL (ref 6.0–8.5)

## 2016-04-03 LAB — MICROALBUMIN, URINE WAIVED
Creatinine, Urine Waived: 50 mg/dL (ref 10–300)
MICROALB, UR WAIVED: 150 mg/L — AB (ref 0–19)
Microalb/Creat Ratio: >300 mg/g — ABNORMAL HIGH (ref <30)

## 2016-04-03 LAB — BAYER DCA HB A1C WAIVED: HB A1C: 10.8 % — AB (ref <7.0)

## 2016-04-03 LAB — TSH: TSH: 2.99 u[IU]/mL (ref 0.450–4.500)

## 2016-04-07 ENCOUNTER — Telehealth: Payer: Self-pay | Admitting: Family Medicine

## 2016-04-09 NOTE — Telephone Encounter (Signed)
Patient called back. I explained to her that Dr. Laural BenesJohnson has been out of the office so Dr. Dossie Arbourrissman was trying to call her about her labs and insulin but he is out of the office today. Called Dr. Laural BenesJohnson and she asked for me to get the patient's blood sugar readings and have her stay on her current insulin dosage until she calls her on Monday. Patient old me that her lowest blood sugar reading has been 263 and the highest was 350. She stated she has been taking the insulin since last Friday. Patient stated that she can be reached at [phone] and I told her that Dr. Laural BenesJohnson said she would call her around lunch time on Monday. Will route to provider for call on Monday.

## 2016-04-12 MED ORDER — INSULIN GLARGINE 100 UNIT/ML SOLOSTAR PEN: 25.0000 [IU] | PEN_INJECTOR | Freq: Every day | SUBCUTANEOUS | 11 refills | Status: DC

## 2016-04-12 NOTE — Telephone Encounter (Signed)
Called and spoke to Monica Sims. Sugars running the same as last week. Will increase her lantus to 25units daily and recheck on Thursday.

## 2016-04-15 ENCOUNTER — Telehealth: Payer: Self-pay | Admitting: Family Medicine

## 2016-04-15 MED ORDER — INSULIN GLARGINE 100 UNIT/ML SOLOSTAR PEN: 30.0000 [IU] | PEN_INJECTOR | Freq: Every day | SUBCUTANEOUS | 11 refills | Status: DC

## 2016-04-15 NOTE — Telephone Encounter (Signed)
-----   Message from Dorcas CarrowMegan P Terasa Orsini, DO sent at 04/12/2016 11:50 AM EST ----- Call about her sugars

## 2016-04-15 NOTE — Telephone Encounter (Signed)
Called to check in on her sugars and adjust. Her insulin.   AM- 350, 290  Has only been able checking the fasting.   She will start checking before bed too.  Will increase her insulin to 30units qHS and recheck on Monday or Tuesday on how they are running.

## 2016-04-16 ENCOUNTER — Ambulatory Visit (INDEPENDENT_AMBULATORY_CARE_PROVIDER_SITE_OTHER): Payer: BLUE CROSS/BLUE SHIELD | Admitting: Family Medicine

## 2016-04-16 ENCOUNTER — Encounter: Payer: Self-pay | Admitting: Family Medicine

## 2016-04-16 VITALS — BP 137/90 | HR 90 | Temp 98.0°F | Resp 17 | Ht 67.21 in | Wt 168.0 lb

## 2016-04-16 DIAGNOSIS — Z794 Long term (current) use of insulin: Secondary | ICD-10-CM

## 2016-04-16 DIAGNOSIS — F411 Generalized anxiety disorder: Secondary | ICD-10-CM | POA: Diagnosis not present

## 2016-04-16 DIAGNOSIS — I10 Essential (primary) hypertension: Secondary | ICD-10-CM

## 2016-04-16 DIAGNOSIS — E1165 Type 2 diabetes mellitus with hyperglycemia: Secondary | ICD-10-CM

## 2016-04-16 DIAGNOSIS — IMO0002 Reserved for concepts with insufficient information to code with codable children: Secondary | ICD-10-CM

## 2016-04-16 DIAGNOSIS — E1121 Type 2 diabetes mellitus with diabetic nephropathy: Secondary | ICD-10-CM

## 2016-04-16 NOTE — Progress Notes (Signed)
BP 137/90 (BP Location: Left Arm, Patient Position: Sitting, Cuff Size: Normal)   Pulse 90   Temp 98 F (36.7 C) (Oral)   Resp 17   Ht 5' 7.21" (1.707 m)   Wt 168 lb (76.2 kg)   LMP 04/13/2016   SpO2 99%   BMI 26.15 kg/m    Subjective:    Patient ID: Monica Sims, female    DOB: 1975/03/30, 41 y.o.   MRN: [id number]  HPI: ALPHA CHOUINARD is a 41 y.o. female  Chief Complaint  Patient presents with  . Anxiety    Mostly when out of house/ does okay at home   ANXIETY/STRESS Duration:better Anxious mood: yes  Excessive worrying: yes Irritability: no  Sweating: no Nausea: no Palpitations:no Hyperventilation: no Panic attacks: yes Agoraphobia: yes  Obscessions/compulsions: no Depressed mood: no Depression screen [facility] 2/9 04/02/2016 07/10/2015  Decreased Interest 1 0  Down, Depressed, Hopeless 2 0  PHQ - 2 Score 3 0  Altered sleeping 0 -  Tired, decreased energy 0 -  Change in appetite 1 -  Feeling bad or failure about yourself  0 -  Trouble concentrating 2 -  Moving slowly or fidgety/restless 0 -  Suicidal thoughts 0 -  PHQ-9 Score 6 -   Anhedonia: no Weight changes: no Insomnia: no   Hypersomnia: no Fatigue/loss of energy: yes Feelings of worthlessness: no Feelings of guilt: no Impaired concentration/indecisiveness: no Suicidal ideations: no  Crying spells: no Recent Stressors/Life Changes: yes   Relationship problems: no   Family stress: yes     Financial stress: no   HYPERTENSION Hypertension status: controlled  Satisfied with current treatment? yes Duration of hypertension: chronic BP monitoring frequency:  not checking BP medication side effects:  no Medication compliance: excellent compliance Previous BP meds: amlodipine, lisinopril, propranolol Aspirin: no Recurrent headaches: no Visual changes: no Palpitations: no Dyspnea: no Chest pain: no Lower extremity edema: no Dizzy/lightheaded: no    Relevant past medical, surgical,  family and social history reviewed and updated as indicated. Interim medical history since our last visit reviewed. Allergies and medications reviewed and updated.  Review of Systems  Constitutional: Negative.   Respiratory: Negative.   Cardiovascular: Negative.   Skin: Negative.   Psychiatric/Behavioral: Negative.     Per HPI unless specifically indicated above     Objective:    BP 137/90 (BP Location: Left Arm, Patient Position: Sitting, Cuff Size: Normal)   Pulse 90   Temp 98 F (36.7 C) (Oral)   Resp 17   Ht 5' 7.21" (1.707 m)   Wt 168 lb (76.2 kg)   LMP 04/13/2016   SpO2 99%   BMI 26.15 kg/m   Wt Readings from Last 3 Encounters:  04/16/16 168 lb (76.2 kg)  04/02/16 167 lb (75.8 kg)  07/10/15 173 lb (78.5 kg)    Physical Exam  Constitutional: She is oriented to person, place, and time. She appears well-developed and well-nourished. No distress.  HENT:  Head: Normocephalic and atraumatic.  Right Ear: Hearing normal.  Left Ear: Hearing normal.  Nose: Nose normal.  Eyes: Conjunctivae and lids are normal. Right eye exhibits no discharge. Left eye exhibits no discharge. No scleral icterus.  Cardiovascular: Normal rate, regular rhythm, normal heart sounds and intact distal pulses.  Exam reveals no gallop and no friction rub.   No murmur heard. Pulmonary/Chest: Effort normal and breath sounds normal. No respiratory distress. She has no wheezes. She has no rales. She exhibits no tenderness.  Musculoskeletal: Normal  range of motion.  Neurological: She is alert and oriented to person, place, and time.  Skin: Skin is warm, dry and intact. No rash noted. No erythema. No pallor.  Psychiatric: She has a normal mood and affect. Her speech is normal and behavior is normal. Judgment and thought content normal. Cognition and memory are normal.  Nursing note and vitals reviewed.   Results for orders placed or performed in visit on 04/02/16  WET PREP FOR TRICH, YEAST, CLUE  Result  Value Ref Range   Trichomonas Exam Negative Negative   Yeast Exam Positive (A) Negative   Clue Cell Exam Positive (A) Negative  Microscopic Examination  Result Value Ref Range   WBC, UA 0-5 0 - 5 /hpf   RBC, UA None seen 0 - 2 /hpf   Epithelial Cells (non renal) 0-10 0 - 10 /hpf   Bacteria, UA Few (A) None seen/Few  Bayer DCA Hb A1c Waived  Result Value Ref Range   Bayer DCA Hb A1c Waived 10.8 (H) <7.0 %  CBC with Differential/Platelet  Result Value Ref Range   WBC 7.5 3.4 - 10.8 x10E3/uL   RBC 5.11 3.77 - 5.28 x10E6/uL   Hemoglobin 14.6 11.1 - 15.9 g/dL   Hematocrit 16.1 09.6 - 46.6 %   MCV 84 79 - 97 fL   MCH 28.6 26.6 - 33.0 pg   MCHC 34.0 31.5 - 35.7 g/dL   RDW 04.5 40.9 - 81.1 %   Platelets 212 150 - 379 x10E3/uL   Neutrophils 69 Not Estab. %   Lymphs 25 Not Estab. %   Monocytes 4 Not Estab. %   Eos 2 Not Estab. %   Basos 0 Not Estab. %   Neutrophils Absolute 5.1 1.4 - 7.0 x10E3/uL   Lymphocytes Absolute 1.8 0.7 - 3.1 x10E3/uL   Monocytes Absolute 0.3 0.1 - 0.9 x10E3/uL   EOS (ABSOLUTE) 0.2 0.0 - 0.4 x10E3/uL   Basophils Absolute 0.0 0.0 - 0.2 x10E3/uL   Immature Granulocytes 0 Not Estab. %   Immature Grans (Abs) 0.0 0.0 - 0.1 x10E3/uL  Comprehensive metabolic panel  Result Value Ref Range   Glucose 294 (H) 65 - 99 mg/dL   BUN 10 6 - 24 mg/dL   Creatinine, Ser 9.14 (L) 0.57 - 1.00 mg/dL   GFR calc non Af Amer 115 >59 mL/min/1.73   GFR calc Af Amer 133 >59 mL/min/1.73   BUN/Creatinine Ratio 18 9 - 23   Sodium 136 134 - 144 mmol/L   Potassium 3.9 3.5 - 5.2 mmol/L   Chloride 97 96 - 106 mmol/L   CO2 20 18 - 29 mmol/L   Calcium 9.2 8.7 - 10.2 mg/dL   Total Protein 6.5 6.0 - 8.5 g/dL   Albumin 4.4 3.5 - 5.5 g/dL   Globulin, Total 2.1 1.5 - 4.5 g/dL   Albumin/Globulin Ratio 2.1 1.2 - 2.2   Bilirubin Total 0.6 0.0 - 1.2 mg/dL   Alkaline Phosphatase 123 (H) 39 - 117 IU/L   AST 35 0 - 40 IU/L   ALT 60 (H) 0 - 32 IU/L  Microalbumin, Urine Waived  Result Value Ref  Range   Microalb, Ur Waived 150 (H) 0 - 19 mg/L   Creatinine, Urine Waived 50 10 - 300 mg/dL   Microalb/Creat Ratio >300 (H) <30 mg/g  Lipid Panel Piccolo, Waived  Result Value Ref Range   Cholesterol Piccolo, Waived 178 <200 mg/dL   HDL Chol Piccolo, Waived 39 (L) >59 mg/dL   Triglycerides Piccolo,Waived  369 (H) <150 mg/dL   Chol/HDL Ratio Piccolo,Waive 4.5 mg/dL   LDL Chol Calc Piccolo Waived 64 <100 mg/dL   VLDL Chol Calc Piccolo,Waive 74 (H) <30 mg/dL  TSH  Result Value Ref Range   TSH 2.990 0.450 - 4.500 uIU/mL  UA/M w/rflx Culture, Routine  Result Value Ref Range   Specific Gravity, UA 1.015 1.005 - 1.030   pH, UA 5.5 5.0 - 7.5   Color, UA Yellow Yellow   Appearance Ur Cloudy (A) Clear   Leukocytes, UA Trace (A) Negative   Protein, UA 2+ (A) Negative/Trace   Glucose, UA 3+ (A) Negative   Ketones, UA 2+ (A) Negative   RBC, UA Trace (A) Negative   Bilirubin, UA Negative Negative   Urobilinogen, Ur 0.2 0.2 - 1.0 mg/dL   Nitrite, UA Negative Negative   Microscopic Examination See below:       Assessment & Plan:   Problem List Items Addressed This Visit      Cardiovascular and Mediastinum   Hypertension    Under better control. Continue current regimen. Continue to monitor. Call with any concerns.         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 Recently increased to 25U qHS- will call and adjust again on Tuesday or Wednesday next week.         Other   Anxiety disorder    Doing better on the lexapro. Still early. Will recheck in 1 month and see how she's doing.           Follow up plan: Return in about 4 weeks (around 05/14/2016) for Mood follow up.

## 2016-04-16 NOTE — Assessment & Plan Note (Signed)
Adjusting insulin dose by phone. Recently increased to 25U qHS- will call and adjust again on Tuesday or Wednesday next week.

## 2016-04-16 NOTE — Assessment & Plan Note (Signed)
Under better control. Continue current regimen. Continue to monitor. Call with any concerns.  

## 2016-04-16 NOTE — Assessment & Plan Note (Signed)
Doing better on the lexapro. Still early. Will recheck in 1 month and see how she's doing.

## 2016-04-20 ENCOUNTER — Telehealth: Payer: Self-pay | Admitting: Family Medicine

## 2016-04-20 MED ORDER — INSULIN GLARGINE 100 UNIT/ML SOLOSTAR PEN: 35.0000 [IU] | PEN_INJECTOR | Freq: Every day | SUBCUTANEOUS | 11 refills | Status: DC

## 2016-04-20 NOTE — Telephone Encounter (Signed)
Called and checked with Megin. Sugars running   Sunday: AM:281, PM 300 Monday: AM: 270, PM 282 Today: AM: 293  Will increase her lantus to 35 units daily and recheck on Friday. Call with any concerns.

## 2016-04-20 NOTE — Telephone Encounter (Signed)
-----   Message from Dorcas CarrowMegan P Jadore Veals, DO sent at 04/15/2016 11:56 AM EST ----- Call about her sugars

## 2016-04-23 ENCOUNTER — Telehealth: Payer: Self-pay | Admitting: Family Medicine

## 2016-04-23 MED ORDER — INSULIN GLARGINE 100 UNIT/ML SOLOSTAR PEN: 40.0000 [IU] | PEN_INJECTOR | Freq: Every day | SUBCUTANEOUS | 11 refills | Status: DC

## 2016-04-23 NOTE — Telephone Encounter (Signed)
High 278, low 248, feeling well. No concerns. No problems with sweating at night. Will increase to 40Units daily and recheck on Tuesday.

## 2016-04-23 NOTE — Telephone Encounter (Signed)
-----   Message from Dorcas CarrowMegan P Hala Nar[place], [place] sent at 04/20/2016  1:06 PM EST ----- Call about her sugars

## 2016-04-27 ENCOUNTER — Telehealth: Payer: Self-pay | Admitting: Family Medicine

## 2016-04-27 MED ORDER — INSULIN GLARGINE 100 UNIT/ML SOLOSTAR PEN: 45.0000 [IU] | PEN_INJECTOR | Freq: Every day | SUBCUTANEOUS | 11 refills | Status: DC

## 2016-04-27 NOTE — Telephone Encounter (Signed)
-----   Message from Dorcas CarrowMegan P Johnson, DO sent at 04/23/2016 11:37 AM EST ----- Call about her blood sugars

## 2016-04-27 NOTE — Telephone Encounter (Signed)
Lowest sugar in 252 in the AM and 262 at the PM, taking 40 units at bedtime. Will increase her lantus to 45 units and recheck on Friday.

## 2016-05-03 ENCOUNTER — Telehealth: Payer: Self-pay | Admitting: Family Medicine

## 2016-05-03 ENCOUNTER — Other Ambulatory Visit: Payer: Self-pay | Admitting: Family Medicine

## 2016-05-03 MED ORDER — INSULIN GLARGINE 100 UNIT/ML SOLOSTAR PEN: 50.0000 [IU] | PEN_INJECTOR | Freq: Every day | SUBCUTANEOUS | 11 refills | Status: DC

## 2016-05-03 NOTE — Telephone Encounter (Signed)
-----   Message from Dorcas CarrowMegan P Pesach Frisch, DO sent at 04/27/2016  4:41 PM EDT ----- Call about her sugars

## 2016-05-03 NOTE — Telephone Encounter (Signed)
Lowest sugar was before bed- 202, highest in the AM 242 with the medicine-- that was before she took her medicine. Has not been getting jittery or dizzy or waking up feeling funny in the middle of the night. Will increase her lantus to 50units. Recheck 1 week.

## 2016-05-10 ENCOUNTER — Telehealth: Payer: Self-pay | Admitting: Family Medicine

## 2016-05-10 MED ORDER — INSULIN GLARGINE 100 UNIT/ML SOLOSTAR PEN: 55.0000 [IU] | PEN_INJECTOR | Freq: Every day | SUBCUTANEOUS | 11 refills | Status: DC

## 2016-05-10 NOTE — Telephone Encounter (Signed)
Sugars have been running 205 before bed and 237 fasting. Will increase her lantus to 55 units at night and recheck in with her on Wednesday or Thursday.

## 2016-05-10 NOTE — Telephone Encounter (Signed)
-----   Message from Dorcas CarrowMegan P Neil Errickson, DO sent at 05/03/2016 12:56 PM EDT ----- Call about her sugars

## 2016-05-13 ENCOUNTER — Telehealth: Payer: Self-pay | Admitting: Family Medicine

## 2016-05-13 MED ORDER — INSULIN GLARGINE 100 UNIT/ML SOLOSTAR PEN: 60.0000 [IU] | PEN_INJECTOR | Freq: Every day | SUBCUTANEOUS | 11 refills | Status: DC

## 2016-05-13 NOTE — Telephone Encounter (Signed)
-----   Message from Dorcas CarrowMegan P Johnson, DO sent at 05/10/2016  1:15 PM EDT ----- Call about Sugars

## 2016-05-13 NOTE — Telephone Encounter (Signed)
Called and spoke to [place]. Lowest 216- highest 217. Will increase her lantus to 60 units qHS and recheck in a couple of days.

## 2016-05-14 ENCOUNTER — Telehealth: Payer: Self-pay | Admitting: Family Medicine

## 2016-05-14 MED ORDER — INSULIN GLARGINE 100 UNIT/ML SOLOSTAR PEN: 60.0000 [IU] | PEN_INJECTOR | Freq: Every day | SUBCUTANEOUS | 11 refills | Status: DC

## 2016-05-14 NOTE — Telephone Encounter (Signed)
Routing to provider  

## 2016-05-14 NOTE — Telephone Encounter (Signed)
Rx sent to her pharmacy 

## 2016-05-14 NOTE — Telephone Encounter (Signed)
Pt needs new prescription for Insulin Glargine (LANTUS SOLOSTAR) 100 UNIT/ML Solostar Pen with the sliding scale dosage. Pharmacy will not fill it until the 20th. Pt stated that she was at 60 units as of today. Pt uses [facility] [address].

## 2016-05-17 ENCOUNTER — Telehealth: Payer: Self-pay | Admitting: Family Medicine

## 2016-05-17 ENCOUNTER — Other Ambulatory Visit: Payer: Self-pay | Admitting: Family Medicine

## 2016-05-17 MED ORDER — INSULIN GLARGINE 100 UNIT/ML SOLOSTAR PEN: 65.0000 [IU] | PEN_INJECTOR | Freq: Every day | SUBCUTANEOUS | 11 refills | Status: DC

## 2016-05-17 NOTE — Telephone Encounter (Signed)
-----   Message from Dorcas Carrow, [place] sent at 05/13/2016  5:06 PM EDT ----- Call about sugars

## 2016-05-17 NOTE — Telephone Encounter (Signed)
Sugars fasting: 165  Before bed 158  Will increase her lantus to 65 units qHS and recheck in a few days

## 2016-05-20 ENCOUNTER — Telehealth: Payer: Self-pay | Admitting: Family Medicine

## 2016-05-20 NOTE — Telephone Encounter (Signed)
-----   Message from Dorcas Carrow, DO sent at 05/17/2016 11:43 AM EDT ----- Call about her sugars

## 2016-05-20 NOTE — Telephone Encounter (Signed)
Called to check in on how her sugars are doing. LMOM for her to call back.

## 2016-05-21 NOTE — Telephone Encounter (Signed)
Called to check on her sugars. LMOM for her to call back.  

## 2016-05-24 NOTE — Telephone Encounter (Signed)
Called to check on her sugars. LMOM for her to call back.  

## 2016-06-04 ENCOUNTER — Encounter: Payer: Self-pay | Admitting: Family Medicine

## 2016-06-04 NOTE — Telephone Encounter (Signed)
I can't get her on the phone- can you make sure she has a follow up scheduled for June?

## 2016-06-04 NOTE — Telephone Encounter (Signed)
Letter generated to send to patient.

## 2016-06-04 NOTE — Telephone Encounter (Signed)
Called and left a message for patient to call and schedule an appointment.

## 2016-07-09 ENCOUNTER — Ambulatory Visit: Payer: BLUE CROSS/BLUE SHIELD | Admitting: Family Medicine

## 2016-07-22 ENCOUNTER — Ambulatory Visit (INDEPENDENT_AMBULATORY_CARE_PROVIDER_SITE_OTHER): Payer: BLUE CROSS/BLUE SHIELD | Admitting: Family Medicine

## 2016-07-22 ENCOUNTER — Encounter: Payer: Self-pay | Admitting: Family Medicine

## 2016-07-22 VITALS — BP 172/119 | HR 86 | Temp 98.5°F | Wt 171.2 lb

## 2016-07-22 DIAGNOSIS — Z794 Long term (current) use of insulin: Secondary | ICD-10-CM

## 2016-07-22 DIAGNOSIS — N912 Amenorrhea, unspecified: Secondary | ICD-10-CM | POA: Diagnosis not present

## 2016-07-22 DIAGNOSIS — I129 Hypertensive chronic kidney disease with stage 1 through stage 4 chronic kidney disease, or unspecified chronic kidney disease: Secondary | ICD-10-CM | POA: Diagnosis not present

## 2016-07-22 DIAGNOSIS — E1121 Type 2 diabetes mellitus with diabetic nephropathy: Secondary | ICD-10-CM | POA: Diagnosis not present

## 2016-07-22 DIAGNOSIS — E1165 Type 2 diabetes mellitus with hyperglycemia: Secondary | ICD-10-CM | POA: Diagnosis not present

## 2016-07-22 DIAGNOSIS — IMO0002 Reserved for concepts with insufficient information to code with codable children: Secondary | ICD-10-CM

## 2016-07-22 LAB — BAYER DCA HB A1C WAIVED: HB A1C: 7.3 % — AB (ref <7.0)

## 2016-07-22 LAB — PREGNANCY, URINE: PREG TEST UR: NEGATIVE

## 2016-07-22 MED ORDER — PROPRANOLOL HCL ER 60 MG PO CP24: 60.0000 mg | ORAL_CAPSULE | Freq: Every day | ORAL | 3 refills | Status: DC

## 2016-07-22 NOTE — Assessment & Plan Note (Signed)
A1c down to 7.3! Continue current regimen. Continue to monitor. Recheck 3 months.

## 2016-07-22 NOTE — Progress Notes (Signed)
BP (!) 172/119 (BP Location: Left Arm, Patient Position: Sitting, Cuff Size: Large)   Pulse 86   Temp 98.5 F (36.9 C)   Wt 171 lb 3.2 oz (77.7 kg)   LMP 05/31/2016 (Exact Date)   SpO2 100%   BMI 26.65 kg/m    Subjective:    Patient ID: Monica Sims, female    DOB: [birth year], 41 y.o.   MRN: [id number]  HPI: Monica Sims is a 41 y.o. female  Chief Complaint  Patient presents with  . Diabetes  . Amenorrhea    Patient was due for her period in May, has not had it and due again today. Patient has stopped all her medications that may be harmful if she is pregnant.    DIABETES Hypoglycemic episodes:no Polydipsia/polyuria: no Visual disturbance: no Chest pain: no Paresthesias: no Glucose Monitoring: yes  Accucheck frequency: BID Taking Insulin?: yes  Long acting insulin: lantus Blood Pressure Monitoring: not checking Retinal Examination: Not up to Date Foot Exam: Done today Diabetic Education: Completed Pneumovax: Not up to Date Influenza: Not up to Date Aspirin: no  HYPERTENSION- stopped all her medicine because she wasn't sure if it would be safe if she was pregnant.  Hypertension status: uncontrolled  Satisfied with current treatment? no Duration of hypertension: chronic BP monitoring frequency:  not checking BP medication side effects:  no Medication compliance: poor compliance Previous BP meds:amlodipine, propranolol Aspirin: no Recurrent headaches: no Visual changes: no Palpitations: no Dyspnea: no Chest pain: no Lower extremity edema: no Dizzy/lightheaded: no   AMENORRHEA- Last Period April 16 Home pregnancy test: Didn't check  Nausea: yes Vomiting: no Breast tenderness: yes Abdominal pain: no Vaginal bleeding: yes- spotting May 27- 2 days Pregnancy or labor complications: no Contraception: None   Relevant past medical, surgical, family and social history reviewed and updated as indicated. Interim medical history since our last visit  reviewed. Allergies and medications reviewed and updated.  Review of Systems  Constitutional: Negative.   Respiratory: Negative.   Cardiovascular: Negative.   Psychiatric/Behavioral: Negative.     Per HPI unless specifically indicated above     Objective:    BP (!) 172/119 (BP Location: Left Arm, Patient Position: Sitting, Cuff Size: Large)   Pulse 86   Temp 98.5 F (36.9 C)   Wt 171 lb 3.2 oz (77.7 kg)   LMP 05/31/2016 (Exact Date)   SpO2 100%   BMI 26.65 kg/m   Wt Readings from Last 3 Encounters:  07/22/16 171 lb 3.2 oz (77.7 kg)  04/16/16 168 lb (76.2 kg)  04/02/16 167 lb (75.8 kg)    Physical Exam  Constitutional: She is oriented to person, place, and time. She appears well-developed and well-nourished. No distress.  HENT:  Head: Normocephalic and atraumatic.  Right Ear: Hearing normal.  Left Ear: Hearing normal.  Nose: Nose normal.  Eyes: Conjunctivae and lids are normal. Right eye exhibits no discharge. Left eye exhibits no discharge. No scleral icterus.  Cardiovascular: Normal rate, regular rhythm, normal heart sounds and intact distal pulses.  Exam reveals no gallop and no friction rub.   No murmur heard. Pulmonary/Chest: Effort normal and breath sounds normal. No respiratory distress. She has no wheezes. She has no rales. She exhibits no tenderness.  Musculoskeletal: Normal range of motion.  Neurological: She is alert and oriented to person, place, and time.  Skin: Skin is warm, dry and intact. No rash noted. No erythema. No pallor.  Psychiatric: She has a normal mood and affect. Her  speech is normal and behavior is normal. Judgment and thought content normal. Cognition and memory are normal.  Nursing note and vitals reviewed.   Results for orders placed or performed in visit on 04/02/16  WET PREP FOR TRICH, YEAST, CLUE  Result Value Ref Range   Trichomonas Exam Negative Negative   Yeast Exam Positive (A) Negative   Clue Cell Exam Positive (A) Negative    Microscopic Examination  Result Value Ref Range   WBC, UA 0-5 0 - 5 /hpf   RBC, UA None seen 0 - 2 /hpf   Epithelial Cells (non renal) 0-10 0 - 10 /hpf   Bacteria, UA Few (A) None seen/Few  Bayer DCA Hb A1c Waived  Result Value Ref Range   Bayer DCA Hb A1c Waived 10.8 (H) <7.0 %  CBC with Differential/Platelet  Result Value Ref Range   WBC 7.5 3.4 - 10.8 x10E3/uL   RBC 5.11 3.77 - 5.28 x10E6/uL   Hemoglobin 14.6 11.1 - 15.9 g/dL   Hematocrit 40.943.0 81.134.0 - 46.6 %   MCV 84 79 - 97 fL   MCH 28.6 26.6 - 33.0 pg   MCHC 34.0 31.5 - 35.7 g/dL   RDW 91.413.5 78.212.3 - 95.615.4 %   Platelets 212 150 - 379 x10E3/uL   Neutrophils 69 Not Estab. %   Lymphs 25 Not Estab. %   Monocytes 4 Not Estab. %   Eos 2 Not Estab. %   Basos 0 Not Estab. %   Neutrophils Absolute 5.1 1.4 - 7.0 x10E3/uL   Lymphocytes Absolute 1.8 0.7 - 3.1 x10E3/uL   Monocytes Absolute 0.3 0.1 - 0.9 x10E3/uL   EOS (ABSOLUTE) 0.2 0.0 - 0.4 x10E3/uL   Basophils Absolute 0.0 0.0 - 0.2 x10E3/uL   Immature Granulocytes 0 Not Estab. %   Immature Grans (Abs) 0.0 0.0 - 0.1 x10E3/uL  Comprehensive metabolic panel  Result Value Ref Range   Glucose 294 (H) 65 - 99 mg/dL   BUN 10 6 - 24 mg/dL   Creatinine, Ser 2.130.56 (L) 0.57 - 1.00 mg/dL   GFR calc non Af Amer 115 >59 mL/min/1.73   GFR calc Af Amer 133 >59 mL/min/1.73   BUN/Creatinine Ratio 18 9 - 23   Sodium 136 134 - 144 mmol/L   Potassium 3.9 3.5 - 5.2 mmol/L   Chloride 97 96 - 106 mmol/L   CO2 20 18 - 29 mmol/L   Calcium 9.2 8.7 - 10.2 mg/dL   Total Protein 6.5 6.0 - 8.5 g/dL   Albumin 4.4 3.5 - 5.5 g/dL   Globulin, Total 2.1 1.5 - 4.5 g/dL   Albumin/Globulin Ratio 2.1 1.2 - 2.2   Bilirubin Total 0.6 0.0 - 1.2 mg/dL   Alkaline Phosphatase 123 (H) 39 - 117 IU/L   AST 35 0 - 40 IU/L   ALT 60 (H) 0 - 32 IU/L  Microalbumin, Urine Waived  Result Value Ref Range   Microalb, Ur Waived 150 (H) 0 - 19 mg/L   Creatinine, Urine Waived 50 10 - 300 mg/dL   Microalb/Creat Ratio >300 (H)  <30 mg/g  Lipid Panel Piccolo, Waived  Result Value Ref Range   Cholesterol Piccolo, Waived 178 <200 mg/dL   HDL Chol Piccolo, Waived 39 (L) >59 mg/dL   Triglycerides Piccolo,Waived 369 (H) <150 mg/dL   Chol/HDL Ratio Piccolo,Waive 4.5 mg/dL   LDL Chol Calc Piccolo Waived 64 <100 mg/dL   VLDL Chol Calc Piccolo,Waive 74 (H) <30 mg/dL  TSH  Result Value Ref Range  TSH 2.990 0.450 - 4.500 uIU/mL  UA/M w/rflx Culture, Routine  Result Value Ref Range   Specific Gravity, UA 1.015 1.005 - 1.030   pH, UA 5.5 5.0 - 7.5   Color, UA Yellow Yellow   Appearance Ur Cloudy (A) Clear   Leukocytes, UA Trace (A) Negative   Protein, UA 2+ (A) Negative/Trace   Glucose, UA 3+ (A) Negative   Ketones, UA 2+ (A) Negative   RBC, UA Trace (A) Negative   Bilirubin, UA Negative Negative   Urobilinogen, Ur 0.2 0.2 - 1.0 mg/dL   Nitrite, UA Negative Negative   Microscopic Examination See below:       Assessment & Plan:   Problem List Items Addressed This Visit      Endocrine   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 Continue current regimen. Continue to monitor. Recheck 3 months.       Relevant Orders   Bayer DCA Hb A1c Waived     Genitourinary   Benign hypertensive renal disease    Not under good control. Will restart her propranolol in case she is pregnant. Call with any concerns.        Other Visit Diagnoses    Amenorrhea       Checking labs today. Await results. Continue PNV. Medications adjusted for pregnancy safety.    Relevant Orders   hCG, serum, qualitative   Pregnancy, urine       Follow up plan: Return in about 4 weeks (around 08/19/2016) for BP follow up.

## 2016-07-22 NOTE — Assessment & Plan Note (Signed)
Not under good control. Will restart her propranolol in case she is pregnant. Call with any concerns.

## 2016-07-23 ENCOUNTER — Telehealth: Payer: Self-pay | Admitting: Family Medicine

## 2016-07-23 LAB — HCG, SERUM, QUALITATIVE: hCG,Beta Subunit,Qual,Serum: NEGATIVE m[IU]/mL (ref <6)

## 2016-07-23 NOTE — Telephone Encounter (Signed)
Please let Monica Sims know that her pregnancy test came back negative. If she hasn't had her period in another 2 weeks, we'll check more labs on her.

## 2016-07-23 NOTE — Telephone Encounter (Signed)
Patient notified of results.

## 2016-08-05 ENCOUNTER — Encounter: Payer: Self-pay | Admitting: Family Medicine

## 2016-08-26 ENCOUNTER — Ambulatory Visit: Payer: BLUE CROSS/BLUE SHIELD | Admitting: Family Medicine

## 2016-10-15 ENCOUNTER — Ambulatory Visit: Payer: BLUE CROSS/BLUE SHIELD | Admitting: Family Medicine

## 2016-11-05 ENCOUNTER — Encounter: Payer: Self-pay | Admitting: Family Medicine

## 2016-11-05 ENCOUNTER — Ambulatory Visit (INDEPENDENT_AMBULATORY_CARE_PROVIDER_SITE_OTHER): Payer: BLUE CROSS/BLUE SHIELD | Admitting: Family Medicine

## 2016-11-05 VITALS — BP 171/101 | HR 67 | Temp 98.4°F | Wt 173.0 lb

## 2016-11-05 DIAGNOSIS — Z794 Long term (current) use of insulin: Secondary | ICD-10-CM | POA: Diagnosis not present

## 2016-11-05 DIAGNOSIS — Z23 Encounter for immunization: Secondary | ICD-10-CM

## 2016-11-05 DIAGNOSIS — IMO0002 Reserved for concepts with insufficient information to code with codable children: Secondary | ICD-10-CM

## 2016-11-05 DIAGNOSIS — E782 Mixed hyperlipidemia: Secondary | ICD-10-CM

## 2016-11-05 DIAGNOSIS — E1121 Type 2 diabetes mellitus with diabetic nephropathy: Secondary | ICD-10-CM | POA: Diagnosis not present

## 2016-11-05 DIAGNOSIS — E1165 Type 2 diabetes mellitus with hyperglycemia: Secondary | ICD-10-CM | POA: Diagnosis not present

## 2016-11-05 DIAGNOSIS — F411 Generalized anxiety disorder: Secondary | ICD-10-CM

## 2016-11-05 DIAGNOSIS — I129 Hypertensive chronic kidney disease with stage 1 through stage 4 chronic kidney disease, or unspecified chronic kidney disease: Secondary | ICD-10-CM | POA: Diagnosis not present

## 2016-11-05 LAB — BAYER DCA HB A1C WAIVED: HB A1C: 9.2 % — AB (ref <7.0)

## 2016-11-05 MED ORDER — INSULIN GLARGINE 100 UNIT/ML SOLOSTAR PEN: 65.0000 [IU] | PEN_INJECTOR | Freq: Every day | SUBCUTANEOUS | 11 refills | Status: AC

## 2016-11-05 MED ORDER — SERTRALINE HCL 100 MG PO TABS: ORAL_TABLET | ORAL | 3 refills | Status: DC

## 2016-11-05 MED ORDER — LABETALOL HCL 100 MG PO TABS: 100.0000 mg | ORAL_TABLET | Freq: Two times a day (BID) | ORAL | 3 refills | Status: DC

## 2016-11-05 NOTE — Assessment & Plan Note (Signed)
Rechecking levels today. Await results.  

## 2016-11-05 NOTE — Assessment & Plan Note (Signed)
Not doing well at all. Would like to start zoloft. Recheck 1 month. Call with any concerns.

## 2016-11-05 NOTE — Assessment & Plan Note (Signed)
Not under good control. Will change from propranolol to labetalol and recheck in 1 month.

## 2016-11-05 NOTE — Progress Notes (Signed)
BP (!) 171/101   Pulse 67   Temp 98.4 F (36.9 C)   Wt 173 lb (78.5 kg)   LMP 10/25/2016 (Exact Date)   SpO2 98%   BMI 26.93 kg/m    Subjective:    Patient ID: Monica Sims, female    DOB: 1975-03-25, 41 y.o.   MRN: [id number]  HPI: Monica Sims is a 41 y.o. female  Chief Complaint  Patient presents with  . Diabetes  . Hypertension  . Anxiety    states it's  been pretty bad lately   DIABETES Hypoglycemic episodes:no Polydipsia/polyuria: no Visual disturbance: no Chest pain: no Paresthesias: no Glucose Monitoring: yes  Accucheck frequency: Daily Taking Insulin?: yes Blood Pressure Monitoring: not checking Retinal Examination: Up to Date Foot Exam: Up to Date Diabetic Education: Completed Pneumovax: Given today Influenza: Given today Aspirin: no  HYPERTENSION / HYPERLIPIDEMIA Satisfied with current treatment? no Duration of hypertension: chronic BP monitoring frequency: not checking BP medication side effects: no Duration of hyperlipidemia: chronic Cholesterol medication side effects: Not on anything Cholesterol supplements: fish oil Past cholesterol medications: fish oil Medication compliance: good compliance Aspirin: no Recent stressors: yes Recurrent headaches: no Visual changes: no Palpitations: no Dyspnea: no Chest pain: no Lower extremity edema: no Dizzy/lightheaded: no  ANXIETY/STRESS Duration:exacerbated Anxious mood: yes  Excessive worrying: yes Irritability: yes  Sweating: no Nausea: no Palpitations:no Hyperventilation: no Panic attacks: no Agoraphobia: no  Obscessions/compulsions: no Depressed mood: yes Depression screen [facility] 2/9 11/05/2016 04/02/2016 07/10/2015  Decreased Interest 2 1 0  Down, Depressed, Hopeless 2 2 0  PHQ - 2 Score 4 3 0  Altered sleeping 2 0 -  Tired, decreased energy 2 0 -  Change in appetite 2 1 -  Feeling bad or failure about yourself  0 0 -  Trouble concentrating 2 2 -  Moving slowly or  fidgety/restless 0 0 -  Suicidal thoughts 0 0 -  PHQ-9 Score 12 6 -  Difficult doing work/chores Very difficult - -   Anhedonia: no Weight changes: no Insomnia: yes hard to fall asleep  Hypersomnia: no Fatigue/loss of energy: yes Feelings of worthlessness: yes Feelings of guilt: yes Impaired concentration/indecisiveness: yes Suicidal ideations: no  Crying spells: yes Recent Stressors/Life Changes: yes   Relationship problems: no   Family stress: yes     Financial stress: yes    Job stress: no    Recent death/loss:yes   Relevant past medical, surgical, family and social history reviewed and updated as indicated. Interim medical history since our last visit reviewed. Allergies and medications reviewed and updated.  Review of Systems  Constitutional: Negative.   Respiratory: Negative.   Cardiovascular: Negative.   Psychiatric/Behavioral: Positive for dysphoric mood and sleep disturbance. Negative for agitation, behavioral problems, confusion, decreased concentration, hallucinations, self-injury and suicidal ideas. The patient is nervous/anxious. The patient is not hyperactive.     Per HPI unless specifically indicated above     Objective:    BP (!) 171/101   Pulse 67   Temp 98.4 F (36.9 C)   Wt 173 lb (78.5 kg)   LMP 10/25/2016 (Exact Date)   SpO2 98%   BMI 26.93 kg/m   Wt Readings from Last 3 Encounters:  11/05/16 173 lb (78.5 kg)  07/22/16 171 lb 3.2 oz (77.7 kg)  04/16/16 168 lb (76.2 kg)    Physical Exam  Constitutional: She is oriented to person, place, and time. She appears well-developed and well-nourished. No distress.  HENT:  Head: Normocephalic and  atraumatic.  Right Ear: Hearing normal.  Left Ear: Hearing normal.  Nose: Nose normal.  Eyes: Conjunctivae and lids are normal. Right eye exhibits no discharge. Left eye exhibits no discharge. No scleral icterus.  Cardiovascular: Normal rate, regular rhythm, normal heart sounds and intact distal pulses.   Exam reveals no gallop and no friction rub.   No murmur heard. Pulmonary/Chest: Effort normal and breath sounds normal. No respiratory distress. She has no wheezes. She has no rales. She exhibits no tenderness.  Musculoskeletal: Normal range of motion.  Neurological: She is alert and oriented to person, place, and time.  Skin: Skin is warm, dry and intact. No rash noted. She is not diaphoretic. No erythema. No pallor.  Psychiatric: She has a normal mood and affect. Her speech is normal and behavior is normal. Judgment and thought content normal. Cognition and memory are normal.  Nursing note and vitals reviewed.   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 Really work on diet and continue current regimen. Recheck 1 month. Call if sugars consistently over 250.      Relevant Medications   Insulin Glargine (LANTUS SOLOSTAR) 100 UNIT/ML Solostar Pen   Other Relevant Orders   Bayer DCA Hb A1c Waived   Comprehensive metabolic panel     Genitourinary   Benign hypertensive renal disease    Not under good control. Will change from propranolol to labetalol and recheck in 1 month.       Relevant Orders   Comprehensive metabolic panel     Other   Hyperlipidemia    Rechecking levels today. Await results.       Relevant Medications   labetalol (NORMODYNE) 100 MG tablet   Other Relevant Orders   Comprehensive metabolic panel   Lipid Panel w/o Chol/HDL Ratio   Anxiety disorder    Not doing well at  all. Would like to start zoloft. Recheck 1 month. Call with any concerns.        Other Visit Diagnoses    Need for immunization against influenza       Flu shot given today   Relevant Orders   Flu Vaccine QUAD 36+ mos IM (Completed)   Immunization due       Pneumovax given today.   Relevant Orders   Pneumococcal polysaccharide vaccine 23-valent greater than or equal to 2yo subcutaneous/IM (Completed)       Follow up plan: Return in about 4 weeks (around 12/03/2016) for Anxiety, BP and DM.

## 2016-11-05 NOTE — Assessment & Plan Note (Signed)
Not under good control with A1c of 9.2. Really work on diet and continue current regimen. Recheck 1 month. Call if sugars consistently over 250.

## 2016-11-06 LAB — COMPREHENSIVE METABOLIC PANEL
A/G RATIO: 1.7 (ref 1.2–2.2)
ALBUMIN: 4.3 g/dL (ref 3.5–5.5)
ALK PHOS: 156 IU/L — AB (ref 39–117)
ALT: 56 IU/L — ABNORMAL HIGH (ref 0–32)
AST: 31 IU/L (ref 0–40)
BILIRUBIN TOTAL: 0.4 mg/dL (ref 0.0–1.2)
BUN / CREAT RATIO: 20 (ref 9–23)
BUN: 10 mg/dL (ref 6–24)
CHLORIDE: 97 mmol/L (ref 96–106)
CO2: 21 mmol/L (ref 20–29)
Calcium: 9.3 mg/dL (ref 8.7–10.2)
Creatinine, Ser: 0.51 mg/dL — ABNORMAL LOW (ref 0.57–1.00)
GFR calc non Af Amer: 118 mL/min/{1.73_m2} (ref >59)
GFR, EST AFRICAN AMERICAN: 136 mL/min/{1.73_m2} (ref >59)
GLOBULIN, TOTAL: 2.6 g/dL (ref 1.5–4.5)
GLUCOSE: 329 mg/dL — AB (ref 65–99)
Potassium: 4.1 mmol/L (ref 3.5–5.2)
SODIUM: 134 mmol/L (ref 134–144)
Total Protein: 6.9 g/dL (ref 6.0–8.5)

## 2016-11-06 LAB — LIPID PANEL W/O CHOL/HDL RATIO
CHOLESTEROL TOTAL: 225 mg/dL — AB (ref 100–199)
HDL: 22 mg/dL — AB (ref >39)
TRIGLYCERIDES: 1322 mg/dL — AB (ref 0–149)

## 2016-12-03 ENCOUNTER — Encounter: Payer: Self-pay | Admitting: Family Medicine

## 2016-12-03 ENCOUNTER — Ambulatory Visit (INDEPENDENT_AMBULATORY_CARE_PROVIDER_SITE_OTHER): Payer: BLUE CROSS/BLUE SHIELD | Admitting: Family Medicine

## 2016-12-03 VITALS — BP 168/88 | HR 72 | Temp 98.5°F | Wt 177.2 lb

## 2016-12-03 DIAGNOSIS — E1165 Type 2 diabetes mellitus with hyperglycemia: Secondary | ICD-10-CM | POA: Diagnosis not present

## 2016-12-03 DIAGNOSIS — IMO0002 Reserved for concepts with insufficient information to code with codable children: Secondary | ICD-10-CM

## 2016-12-03 DIAGNOSIS — F4321 Adjustment disorder with depressed mood: Secondary | ICD-10-CM

## 2016-12-03 DIAGNOSIS — F411 Generalized anxiety disorder: Secondary | ICD-10-CM | POA: Diagnosis not present

## 2016-12-03 DIAGNOSIS — I129 Hypertensive chronic kidney disease with stage 1 through stage 4 chronic kidney disease, or unspecified chronic kidney disease: Secondary | ICD-10-CM | POA: Diagnosis not present

## 2016-12-03 DIAGNOSIS — E1129 Type 2 diabetes mellitus with other diabetic kidney complication: Secondary | ICD-10-CM

## 2016-12-03 MED ORDER — LABETALOL HCL 200 MG PO TABS: 200.0000 mg | ORAL_TABLET | Freq: Two times a day (BID) | ORAL | 3 refills | Status: AC

## 2016-12-03 MED ORDER — SERTRALINE HCL 100 MG PO TABS: ORAL_TABLET | ORAL | 1 refills | Status: AC

## 2016-12-03 NOTE — Assessment & Plan Note (Signed)
Not under good control. Will increase her labetalol to 200mg  BID and recheck in 1 month. Call with any concerns.

## 2016-12-03 NOTE — Progress Notes (Signed)
BP (!) 168/88   Pulse 72   Temp 98.5 F (36.9 C)   Wt 177 lb 4 oz (80.4 kg)   SpO2 98%   BMI 27.59 kg/m    Subjective:    Patient ID: Monica Sims, female    DOB: [birth year], 41 y.o.   MRN: [id number]  HPI: Monica Sims is a 41 y.o. female  Chief Complaint  Patient presents with  . Anxiety  . Hypertension  . Diabetes   ANXIETY/STRESS Duration:better Anxious mood: no  Excessive worrying: no Irritability: no  Sweating: no Nausea: no Palpitations:no Hyperventilation: no Panic attacks: no Agoraphobia: no  Obscessions/compulsions: no Depressed mood: no Depression screen [facility] 2/9 11/05/2016 04/02/2016 07/10/2015  Decreased Interest 2 1 0  Down, Depressed, Hopeless 2 2 0  PHQ - 2 Score 4 3 0  Altered sleeping 2 0 -  Tired, decreased energy 2 0 -  Change in appetite 2 1 -  Feeling bad or failure about yourself  0 0 -  Trouble concentrating 2 2 -  Moving slowly or fidgety/restless 0 0 -  Suicidal thoughts 0 0 -  PHQ-9 Score 12 6 -  Difficult doing work/chores Very difficult - -   Anhedonia: no Weight changes: no Insomnia: no   Hypersomnia: no Fatigue/loss of energy: no Feelings of worthlessness: no Feelings of guilt: no Impaired concentration/indecisiveness: no Suicidal ideations: no  Crying spells: no Recent Stressors/Life Changes: no  HYPERTENSION Hypertension status: uncontrolled  Satisfied with current treatment? no Duration of hypertension: chronic BP monitoring frequency:  not checking BP medication side effects:  no Medication compliance: excellent compliance Aspirin: no Recurrent headaches: no Visual changes: no Palpitations: no Dyspnea: no Chest pain: no Lower extremity edema: no Dizzy/lightheaded: no  Sugars have been running about the same. Has been watching her diet.   Relevant past medical, surgical, family and social history reviewed and updated as indicated. Interim medical history since our last visit reviewed. Allergies  and medications reviewed and updated.  Review of Systems  Constitutional: Negative.   Respiratory: Negative.   Cardiovascular: Negative.   Psychiatric/Behavioral: Negative.     Per HPI unless specifically indicated above     Objective:    BP (!) 168/88   Pulse 72   Temp 98.5 F (36.9 C)   Wt 177 lb 4 oz (80.4 kg)   SpO2 98%   BMI 27.59 kg/m   Wt Readings from Last 3 Encounters:  12/03/16 177 lb 4 oz (80.4 kg)  11/05/16 173 lb (78.5 kg)  07/22/16 171 lb 3.2 oz (77.7 kg)    Physical Exam  Constitutional: She is oriented to person, place, and time. She appears well-developed and well-nourished. No distress.  HENT:  Head: Normocephalic and atraumatic.  Right Ear: Hearing normal.  Left Ear: Hearing normal.  Nose: Nose normal.  Eyes: Conjunctivae and lids are normal. Right eye exhibits no discharge. Left eye exhibits no discharge. No scleral icterus.  Cardiovascular: Normal rate, regular rhythm, normal heart sounds and intact distal pulses.  Exam reveals no gallop and no friction rub.   No murmur heard. Pulmonary/Chest: Effort normal and breath sounds normal. No respiratory distress. She has no wheezes. She has no rales. She exhibits no tenderness.  Musculoskeletal: Normal range of motion.  Neurological: She is alert and oriented to person, place, and time.  Skin: Skin is warm, dry and intact. No rash noted. She is not diaphoretic. No erythema. No pallor.  Psychiatric: She has a normal mood and affect. Her  speech is normal and behavior is normal. Judgment and thought content normal. Cognition and memory are normal.    Results for orders placed or performed in visit on 11/05/16  Bayer DCA Hb A1c Waived  Result Value Ref Range   Bayer DCA Hb A1c Waived 9.2 (H) <7.0 %  Lipid Panel w/o Chol/HDL Ratio  Result Value Ref Range   Cholesterol, Total 225 (H) 100 - 199 mg/dL   Triglycerides 1,6101,322 (HH) 0 - 149 mg/dL   HDL 22 (L) >96>39 mg/dL   VLDL Cholesterol Cal Comment 5 - 40 mg/dL    LDL Calculated Comment 0 - 99 mg/dL  Comprehensive metabolic panel  Result Value Ref Range   Glucose 329 (H) 65 - 99 mg/dL   BUN 10 6 - 24 mg/dL   Creatinine, Ser 0.450.51 (L) 0.57 - 1.00 mg/dL   GFR calc non Af Amer 118 >59 mL/min/1.73   GFR calc Af Amer 136 >59 mL/min/1.73   BUN/Creatinine Ratio 20 9 - 23   Sodium 134 134 - 144 mmol/L   Potassium 4.1 3.5 - 5.2 mmol/L   Chloride 97 96 - 106 mmol/L   CO2 21 20 - 29 mmol/L   Calcium 9.3 8.7 - 10.2 mg/dL   Total Protein 6.9 6.0 - 8.5 g/dL   Albumin 4.3 3.5 - 5.5 g/dL   Globulin, Total 2.6 1.5 - 4.5 g/dL   Albumin/Globulin Ratio 1.7 1.2 - 2.2   Bilirubin Total 0.4 0.0 - 1.2 mg/dL   Alkaline Phosphatase 156 (H) 39 - 117 IU/L   AST 31 0 - 40 IU/L   ALT 56 (H) 0 - 32 IU/L      Assessment & Plan:   Problem List Items Addressed This Visit      Endocrine   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 Continue current regimen. Continue to monitor. Continue diet and exercise. Call with any concerns.         Genitourinary   Benign hypertensive renal disease    Not under good control. Will increase her labetalol to 200mg  BID and recheck in 1 month. Call with any concerns.         Other   Anxiety disorder - Primary    Doing much better on her current regimen. Continue current regimen. Continue to monitor. Call with any concerns.       Relevant Medications   sertraline (ZOLOFT) 100 MG tablet   Adjustment disorder with depressed mood    Doing much better on her current regimen. Continue current regimen. Continue to monitor. Call with any concerns.           Follow up plan: Return in about 4 weeks (around 12/31/2016) for follow up BP.

## 2016-12-03 NOTE — Assessment & Plan Note (Signed)
Doing much better on her current regimen. Continue current regimen. Continue to monitor. Call with any concerns.  

## 2016-12-03 NOTE — Assessment & Plan Note (Signed)
Stable. Continue current regimen. Continue to monitor. Continue diet and exercise. Call with any concerns.

## 2017-01-10 ENCOUNTER — Ambulatory Visit: Payer: BLUE CROSS/BLUE SHIELD | Admitting: Family Medicine
